# Patient Record
Sex: Female | Born: 1970
Health system: Southern US, Community
[De-identification: ages and names within clinical notes are randomized; demographics above are authoritative.]

## PROBLEM LIST (undated history)

## (undated) DIAGNOSIS — T1490XA Injury, unspecified, initial encounter: Secondary | ICD-10-CM

## (undated) DIAGNOSIS — R112 Nausea with vomiting, unspecified: Secondary | ICD-10-CM

## (undated) DIAGNOSIS — R479 Unspecified speech disturbances: Secondary | ICD-10-CM

## (undated) DIAGNOSIS — S069X9A Unspecified intracranial injury with loss of consciousness of unspecified duration, initial encounter: Secondary | ICD-10-CM

## (undated) DIAGNOSIS — Z9889 Other specified postprocedural states: Secondary | ICD-10-CM

## (undated) DIAGNOSIS — M1990 Unspecified osteoarthritis, unspecified site: Secondary | ICD-10-CM

## (undated) DIAGNOSIS — N950 Postmenopausal bleeding: Secondary | ICD-10-CM

## (undated) DIAGNOSIS — I609 Nontraumatic subarachnoid hemorrhage, unspecified: Secondary | ICD-10-CM

## (undated) DIAGNOSIS — G8190 Hemiplegia, unspecified affecting unspecified side: Secondary | ICD-10-CM

## (undated) HISTORY — PX: JOINT REPLACEMENT: SHX530

## (undated) HISTORY — PX: BREAST REDUCTION SURGERY: SHX8

---
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
## 1999-04-25 ENCOUNTER — Encounter: Payer: Self-pay | Admitting: Emergency Medicine

## 1999-04-25 ENCOUNTER — Emergency Department (HOSPITAL_COMMUNITY): Admission: EM | Admit: 1999-04-25 | Discharge: 1999-04-25 | Payer: Self-pay | Admitting: Emergency Medicine

## 1999-11-23 ENCOUNTER — Emergency Department (HOSPITAL_COMMUNITY): Admission: EM | Admit: 1999-11-23 | Discharge: 1999-11-23 | Payer: Self-pay | Admitting: Emergency Medicine

## 2002-06-09 ENCOUNTER — Other Ambulatory Visit: Admission: RE | Admit: 2002-06-09 | Discharge: 2002-06-09 | Payer: Self-pay | Admitting: Obstetrics and Gynecology

## 2003-10-04 ENCOUNTER — Other Ambulatory Visit: Admission: RE | Admit: 2003-10-04 | Discharge: 2003-10-04 | Payer: Self-pay | Admitting: *Deleted

## 2006-02-13 ENCOUNTER — Encounter: Admission: RE | Admit: 2006-02-13 | Discharge: 2006-02-13 | Payer: Self-pay | Admitting: Internal Medicine

## 2006-05-31 ENCOUNTER — Emergency Department (HOSPITAL_COMMUNITY): Admission: EM | Admit: 2006-05-31 | Discharge: 2006-05-31 | Payer: Self-pay | Admitting: Emergency Medicine

## 2006-10-28 ENCOUNTER — Other Ambulatory Visit: Admission: RE | Admit: 2006-10-28 | Discharge: 2006-10-28 | Payer: Self-pay | Admitting: *Deleted

## 2007-10-03 ENCOUNTER — Emergency Department (HOSPITAL_COMMUNITY): Admission: EM | Admit: 2007-10-03 | Discharge: 2007-10-03 | Payer: Self-pay | Admitting: Emergency Medicine

## 2007-10-08 ENCOUNTER — Ambulatory Visit (HOSPITAL_COMMUNITY): Admission: RE | Admit: 2007-10-08 | Discharge: 2007-10-09 | Payer: Self-pay | Admitting: Orthopedic Surgery

## 2009-09-12 ENCOUNTER — Encounter: Admission: RE | Admit: 2009-09-12 | Discharge: 2009-09-12 | Payer: Self-pay | Admitting: Gynecology

## 2010-06-20 ENCOUNTER — Emergency Department (HOSPITAL_COMMUNITY): Admission: EM | Admit: 2010-06-20 | Discharge: 2010-06-20 | Payer: Self-pay | Admitting: Emergency Medicine

## 2010-06-29 ENCOUNTER — Emergency Department (HOSPITAL_COMMUNITY)
Admission: EM | Admit: 2010-06-29 | Discharge: 2010-06-29 | Payer: Self-pay | Source: Home / Self Care | Admitting: Emergency Medicine

## 2010-07-05 ENCOUNTER — Encounter
Admission: RE | Admit: 2010-07-05 | Discharge: 2010-07-05 | Payer: Self-pay | Source: Home / Self Care | Attending: Gynecology | Admitting: Gynecology

## 2010-07-20 ENCOUNTER — Emergency Department (HOSPITAL_COMMUNITY)
Admission: EM | Admit: 2010-07-20 | Discharge: 2010-07-21 | Payer: Self-pay | Source: Home / Self Care | Admitting: Emergency Medicine

## 2010-07-27 ENCOUNTER — Emergency Department (HOSPITAL_COMMUNITY)
Admission: EM | Admit: 2010-07-27 | Discharge: 2010-07-27 | Payer: Self-pay | Source: Home / Self Care | Admitting: Emergency Medicine

## 2010-08-27 ENCOUNTER — Emergency Department (HOSPITAL_COMMUNITY)
Admission: EM | Admit: 2010-08-27 | Discharge: 2010-08-27 | Payer: Self-pay | Source: Home / Self Care | Admitting: Emergency Medicine

## 2010-10-08 LAB — URINALYSIS, ROUTINE W REFLEX MICROSCOPIC
Bilirubin Urine: NEGATIVE
Hgb urine dipstick: NEGATIVE
Nitrite: NEGATIVE
Specific Gravity, Urine: 1.019 (ref 1.005–1.030)

## 2010-10-08 LAB — POCT I-STAT, CHEM 8
BUN: 15 mg/dL (ref 6–23)
Creatinine, Ser: 1.1 mg/dL (ref 0.4–1.2)
Potassium: 4.1 mEq/L (ref 3.5–5.1)
TCO2: 27 mmol/L (ref 0–100)

## 2010-10-08 LAB — URINE CULTURE

## 2010-10-27 ENCOUNTER — Emergency Department (HOSPITAL_COMMUNITY)
Admission: EM | Admit: 2010-10-27 | Discharge: 2010-10-28 | Disposition: A | Discharge status: Home / Self Care | Payer: Medicare Other | Attending: Emergency Medicine | Admitting: Emergency Medicine

## 2010-10-27 DIAGNOSIS — F411 Generalized anxiety disorder: Secondary | ICD-10-CM | POA: Insufficient documentation

## 2010-10-27 DIAGNOSIS — IMO0002 Reserved for concepts with insufficient information to code with codable children: Secondary | ICD-10-CM | POA: Insufficient documentation

## 2010-10-27 DIAGNOSIS — Z87820 Personal history of traumatic brain injury: Secondary | ICD-10-CM | POA: Insufficient documentation

## 2010-11-28 ENCOUNTER — Emergency Department (HOSPITAL_COMMUNITY)
Admission: EM | Admit: 2010-11-28 | Discharge: 2010-11-28 | Disposition: A | Discharge status: Home / Self Care | Payer: Medicare Other | Attending: Emergency Medicine | Admitting: Emergency Medicine

## 2010-11-28 DIAGNOSIS — Z87820 Personal history of traumatic brain injury: Secondary | ICD-10-CM | POA: Insufficient documentation

## 2010-11-28 DIAGNOSIS — Z046 Encounter for general psychiatric examination, requested by authority: Secondary | ICD-10-CM | POA: Insufficient documentation

## 2010-12-07 ENCOUNTER — Emergency Department (HOSPITAL_COMMUNITY)
Admission: EM | Admit: 2010-12-07 | Discharge: 2010-12-07 | Disposition: A | Discharge status: Home / Self Care | Payer: Medicare Other | Attending: Emergency Medicine | Admitting: Emergency Medicine

## 2010-12-07 DIAGNOSIS — Z87820 Personal history of traumatic brain injury: Secondary | ICD-10-CM | POA: Insufficient documentation

## 2010-12-07 DIAGNOSIS — F911 Conduct disorder, childhood-onset type: Secondary | ICD-10-CM | POA: Insufficient documentation

## 2010-12-11 NOTE — Op Note (Signed)
Candace Bailey, Candace Bailey                 ACCOUNT NO.:  [id number]   MEDICAL RECORD NO.:  [id number]          PATIENT TYPE:  OIB   LOCATION:  5036                         FACILITY:  [facility]   PHYSICIAN:  Nadara Mustard, MD     DATE OF BIRTH:  05-16-1971   DATE OF PROCEDURE:  10/08/2007  DATE OF DISCHARGE:                               OPERATIVE REPORT   PREOPERATIVE DIAGNOSIS:  Intra-articular right elbow medial epicondyle  fracture.   POSTOPERATIVE DIAGNOSIS:  Intra-articular right elbow medial epicondyle  fracture.   PROCEDURE:  Open reduction internal fixation right elbow intra-articular  medial condyle fracture.   SURGEON:  Nadara Mustard, MD.   ANESTHESIA:  General.   ESTIMATED BLOOD LOSS:  Minimal.   ANTIBIOTICS:  Kefzol 1 gram.   DRAINS:  None.   COMPLICATIONS:  None.   TOURNIQUET TIME:  None.   DISPOSITION:  To PACU in stable condition.   INDICATIONS FOR PROCEDURE:  Patient is a 40 year old woman who fell onto  a outstretched right arm sustaining a intra-articular medial epicondyle  fracture.  The patient had instability, displaced fracture and presents  at this time for open reduction internal fixation.  Risks and benefits  were discussed with the patient and her mother including infection,  neurovascular injury, injury to the ulnar nerve, nonhealing of the bone,  nonhealing of the wound, need for additional surgery.  The patient and  mother state they understand and wish to proceed at this time.   DESCRIPTION OF PROCEDURE:  The patient is brought to OR room #15 and  underwent a general anesthetic.  After adequate level of anesthesia  obtained, the patient's right upper extremity was prepped using DuraPrep  and draped into a sterile field.  The medial longitudinal incision was  made centered over the medial epicondyle.  Blunt dissection was carried  down to the ulnar nerve.  The ulnar nerve was released and resected out  to facilitate the reduction of the fracture.   The fracture was reduced  and the precontoured Synthes plate was secured distally with two locking  2.7 screws, one locking 3.5 screw and proximally locked with three 3.5  screws.  The arm was placed through a range of motion.  It was stable.  C-arm fluoroscopy verified the reduction.  There was no impingement with  the elbow after reduction.  The wound was irrigated with normal saline.  The ulnar nerve was again inspected.  There was no contusion bruising to the ulnar nerve.  The subcu was  closed using 2-0 Vicryl.  Skin was closed using approximated staples.  The wound was covered Adaptic orthopedic sponges, sterile Webril and  Coban dressing.  The patient was extubated, plan for a 23-hour  observation.      Nadara Mustard, MD  Electronically Signed     MVD/MEDQ  D:  10/08/2007  T:  10/09/2007  Job:  [id number]

## 2011-01-11 ENCOUNTER — Emergency Department (HOSPITAL_COMMUNITY)
Admission: EM | Admit: 2011-01-11 | Discharge: 2011-01-11 | Disposition: A | Discharge status: Home / Self Care | Payer: Medicare Other | Attending: Emergency Medicine | Admitting: Emergency Medicine

## 2011-01-11 DIAGNOSIS — Z139 Encounter for screening, unspecified: Secondary | ICD-10-CM | POA: Insufficient documentation

## 2011-01-11 DIAGNOSIS — F411 Generalized anxiety disorder: Secondary | ICD-10-CM | POA: Insufficient documentation

## 2011-01-11 DIAGNOSIS — W010XXA Fall on same level from slipping, tripping and stumbling without subsequent striking against object, initial encounter: Secondary | ICD-10-CM | POA: Insufficient documentation

## 2011-01-11 DIAGNOSIS — Y9289 Other specified places as the place of occurrence of the external cause: Secondary | ICD-10-CM | POA: Insufficient documentation

## 2011-01-11 DIAGNOSIS — Z87820 Personal history of traumatic brain injury: Secondary | ICD-10-CM | POA: Insufficient documentation

## 2011-02-02 ENCOUNTER — Emergency Department (HOSPITAL_COMMUNITY)
Admission: EM | Admit: 2011-02-02 | Discharge: 2011-02-02 | Disposition: A | Discharge status: Home / Self Care | Payer: Medicare Other | Attending: Emergency Medicine | Admitting: Emergency Medicine

## 2011-02-02 DIAGNOSIS — X58XXXA Exposure to other specified factors, initial encounter: Secondary | ICD-10-CM | POA: Insufficient documentation

## 2011-02-02 DIAGNOSIS — S7010XA Contusion of unspecified thigh, initial encounter: Secondary | ICD-10-CM | POA: Insufficient documentation

## 2011-02-02 DIAGNOSIS — F411 Generalized anxiety disorder: Secondary | ICD-10-CM | POA: Insufficient documentation

## 2011-02-02 DIAGNOSIS — Z87820 Personal history of traumatic brain injury: Secondary | ICD-10-CM | POA: Insufficient documentation

## 2011-03-14 ENCOUNTER — Emergency Department (HOSPITAL_COMMUNITY): Payer: Medicare Other

## 2011-03-14 ENCOUNTER — Emergency Department (HOSPITAL_COMMUNITY)
Admission: EM | Admit: 2011-03-14 | Discharge: 2011-03-14 | Disposition: A | Discharge status: Home / Self Care | Payer: Medicare Other | Attending: Emergency Medicine | Admitting: Emergency Medicine

## 2011-03-14 DIAGNOSIS — S5000XA Contusion of unspecified elbow, initial encounter: Secondary | ICD-10-CM | POA: Insufficient documentation

## 2011-03-14 DIAGNOSIS — W010XXA Fall on same level from slipping, tripping and stumbling without subsequent striking against object, initial encounter: Secondary | ICD-10-CM | POA: Insufficient documentation

## 2011-03-14 DIAGNOSIS — IMO0002 Reserved for concepts with insufficient information to code with codable children: Secondary | ICD-10-CM | POA: Insufficient documentation

## 2011-03-14 DIAGNOSIS — Y9289 Other specified places as the place of occurrence of the external cause: Secondary | ICD-10-CM | POA: Insufficient documentation

## 2011-04-03 ENCOUNTER — Ambulatory Visit: Payer: Medicare Other | Admitting: Physical Therapy

## 2011-04-17 ENCOUNTER — Emergency Department (HOSPITAL_COMMUNITY)
Admission: EM | Admit: 2011-04-17 | Discharge: 2011-04-17 | Disposition: A | Discharge status: Home / Self Care | Payer: Medicare Other | Attending: Emergency Medicine | Admitting: Emergency Medicine

## 2011-04-17 DIAGNOSIS — R0602 Shortness of breath: Secondary | ICD-10-CM | POA: Insufficient documentation

## 2011-04-17 DIAGNOSIS — F411 Generalized anxiety disorder: Secondary | ICD-10-CM | POA: Insufficient documentation

## 2011-04-17 DIAGNOSIS — Z049 Encounter for examination and observation for unspecified reason: Secondary | ICD-10-CM | POA: Insufficient documentation

## 2011-04-17 DIAGNOSIS — Z87820 Personal history of traumatic brain injury: Secondary | ICD-10-CM | POA: Insufficient documentation

## 2011-04-17 LAB — URINALYSIS, ROUTINE W REFLEX MICROSCOPIC
Glucose, UA: NEGATIVE mg/dL
Hgb urine dipstick: NEGATIVE
Specific Gravity, Urine: 1.022 (ref 1.005–1.030)

## 2011-04-22 LAB — CBC
HCT: 37.3
Hemoglobin: 12.9
MCV: 87.8
RBC: 4.25
RDW: 12.8
WBC: 7.6

## 2011-06-06 ENCOUNTER — Other Ambulatory Visit: Payer: Self-pay | Admitting: Gynecology

## 2011-06-06 DIAGNOSIS — N63 Unspecified lump in unspecified breast: Secondary | ICD-10-CM

## 2011-06-26 ENCOUNTER — Emergency Department (HOSPITAL_COMMUNITY): Payer: Medicare Other

## 2011-06-26 ENCOUNTER — Other Ambulatory Visit: Payer: Self-pay

## 2011-06-26 ENCOUNTER — Emergency Department (HOSPITAL_COMMUNITY)
Admission: EM | Admit: 2011-06-26 | Discharge: 2011-06-26 | Disposition: A | Discharge status: Home / Self Care | Payer: Medicare Other | Attending: Emergency Medicine | Admitting: Emergency Medicine

## 2011-06-26 ENCOUNTER — Encounter: Payer: Self-pay | Admitting: Emergency Medicine

## 2011-06-26 DIAGNOSIS — F419 Anxiety disorder, unspecified: Secondary | ICD-10-CM

## 2011-06-26 DIAGNOSIS — R079 Chest pain, unspecified: Secondary | ICD-10-CM | POA: Insufficient documentation

## 2011-06-26 DIAGNOSIS — R0989 Other specified symptoms and signs involving the circulatory and respiratory systems: Secondary | ICD-10-CM | POA: Insufficient documentation

## 2011-06-26 DIAGNOSIS — R0609 Other forms of dyspnea: Secondary | ICD-10-CM | POA: Insufficient documentation

## 2011-06-26 DIAGNOSIS — R471 Dysarthria and anarthria: Secondary | ICD-10-CM | POA: Insufficient documentation

## 2011-06-26 DIAGNOSIS — R0602 Shortness of breath: Secondary | ICD-10-CM | POA: Insufficient documentation

## 2011-06-26 DIAGNOSIS — R279 Unspecified lack of coordination: Secondary | ICD-10-CM | POA: Insufficient documentation

## 2011-06-26 DIAGNOSIS — F411 Generalized anxiety disorder: Secondary | ICD-10-CM | POA: Insufficient documentation

## 2011-06-26 HISTORY — DX: Injury, unspecified, initial encounter: T14.90XA

## 2011-06-26 LAB — POCT I-STAT TROPONIN I

## 2011-06-26 NOTE — ED Notes (Signed)
Given 324mg ASA by EMS.

## 2011-06-26 NOTE — ED Notes (Signed)
[facility] here to take pt home. 

## 2011-06-26 NOTE — ED Notes (Signed)
Pt states she almost fell with walker tonight and called EMS for [facility].  Per [facility], they said pt c/o chest pain at 2030 that had resolved when they arrived.
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
## 2011-07-02 ENCOUNTER — Emergency Department (HOSPITAL_COMMUNITY)
Admission: EM | Admit: 2011-07-02 | Discharge: 2011-07-02 | Disposition: A | Discharge status: Home / Self Care | Payer: Medicare Other | Attending: Emergency Medicine | Admitting: Emergency Medicine

## 2011-07-02 ENCOUNTER — Encounter (HOSPITAL_COMMUNITY): Payer: Self-pay | Admitting: Family Medicine

## 2011-07-02 DIAGNOSIS — R195 Other fecal abnormalities: Secondary | ICD-10-CM | POA: Insufficient documentation

## 2011-07-02 DIAGNOSIS — K5900 Constipation, unspecified: Secondary | ICD-10-CM | POA: Insufficient documentation

## 2011-07-02 LAB — POCT I-STAT, CHEM 8
BUN: 8 mg/dL (ref 6–23)
Calcium, Ion: 1.14 mmol/L (ref 1.12–1.32)
Chloride: 107 mEq/L (ref 96–112)
Creatinine, Ser: 1 mg/dL (ref 0.50–1.10)
Glucose, Bld: 94 mg/dL (ref 70–99)
HCT: 38 % (ref 36.0–46.0)
Hemoglobin: 12.9 g/dL (ref 12.0–15.0)
Potassium: 3.8 mEq/L (ref 3.5–5.1)
Sodium: 139 mEq/L (ref 135–145)
TCO2: 21 mmol/L (ref 0–100)

## 2011-07-02 LAB — OCCULT BLOOD, POC DEVICE: Fecal Occult Bld: POSITIVE

## 2011-07-02 MED ORDER — DISPOSABLE ENEMA 19-7 GM/118ML RE ENEM: 1.0000 | ENEMA | Freq: Once | RECTAL | Status: AC

## 2011-07-02 NOTE — ED Notes (Signed)
WUJ:[id number]<JY> Expected date:07/02/11<BR> Expected time: 4:39 PM<BR> Means of arrival:Ambulance<BR> Comments:<BR> EMS 12 GC, 40 yof constipation
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
## 2011-07-02 NOTE — ED Notes (Signed)
Pt is A/O x4. Skin warm and dry. Respirations even and unlabored. NAD noted at this time.   

## 2011-07-02 NOTE — ED Notes (Signed)
Phlebotomy at bedside.

## 2011-07-02 NOTE — ED Notes (Signed)
[facility] notified of need for transport. 

## 2011-07-02 NOTE — ED Notes (Signed)
Per EMS: pt from home, lives alone. Reports waking up at 03:00 this morning and trying to use bathroom and unable to have a BM. States she saw blood when wiping. States she has not been able to have a BM in 1 month.

## 2011-07-03 NOTE — ED Provider Notes (Signed)
Medical screening examination/treatment/procedure(s) were performed by non-physician practitioner and as supervising physician I was immediately available for consultation/collaboration.  Jetty Berland, MD 07/03/11 0128 

## 2011-07-06 ENCOUNTER — Emergency Department (HOSPITAL_COMMUNITY)
Admission: EM | Admit: 2011-07-06 | Discharge: 2011-07-07 | Disposition: A | Discharge status: Home / Self Care | Payer: Medicare Other | Attending: Emergency Medicine | Admitting: Emergency Medicine

## 2011-07-06 ENCOUNTER — Encounter (HOSPITAL_COMMUNITY): Payer: Self-pay | Admitting: Emergency Medicine

## 2011-07-06 DIAGNOSIS — K529 Noninfective gastroenteritis and colitis, unspecified: Secondary | ICD-10-CM

## 2011-07-06 DIAGNOSIS — K5900 Constipation, unspecified: Secondary | ICD-10-CM | POA: Insufficient documentation

## 2011-07-06 DIAGNOSIS — R300 Dysuria: Secondary | ICD-10-CM | POA: Insufficient documentation

## 2011-07-06 DIAGNOSIS — K5289 Other specified noninfective gastroenteritis and colitis: Secondary | ICD-10-CM | POA: Insufficient documentation

## 2011-07-06 HISTORY — DX: Unspecified intracranial injury with loss of consciousness of unspecified duration, initial encounter: S06.9X9A

## 2011-07-06 NOTE — ED Notes (Signed)
Pt c/o constipation that is lasting for couple of days now

## 2011-07-06 NOTE — ED Notes (Signed)
[id number]<AV> Expected date:07/06/11<BR> Expected time: 9:46 PM<BR> Means of arrival:Ambulance<BR> Comments:<BR> EMS 22 pTAR constipation

## 2011-07-07 ENCOUNTER — Emergency Department (HOSPITAL_COMMUNITY): Payer: Medicare Other

## 2011-07-07 LAB — DIFFERENTIAL
Basophils Absolute: 0 10*3/uL (ref 0.0–0.1)
Basophils Relative: 0 % (ref 0–1)
Lymphocytes Relative: 11 % — ABNORMAL LOW (ref 12–46)
Neutro Abs: 8.6 10*3/uL — ABNORMAL HIGH (ref 1.7–7.7)
Neutrophils Relative %: 78 % — ABNORMAL HIGH (ref 43–77)

## 2011-07-07 LAB — COMPREHENSIVE METABOLIC PANEL
ALT: 13 U/L (ref 0–35)
AST: 12 U/L (ref 0–37)
Albumin: 3.3 g/dL — ABNORMAL LOW (ref 3.5–5.2)
Alkaline Phosphatase: 75 U/L (ref 39–117)
CO2: 22 mEq/L (ref 19–32)
Chloride: 100 mEq/L (ref 96–112)
Potassium: 3.7 mEq/L (ref 3.5–5.1)
Total Bilirubin: 0.5 mg/dL (ref 0.3–1.2)

## 2011-07-07 LAB — URINALYSIS, ROUTINE W REFLEX MICROSCOPIC
Glucose, UA: NEGATIVE mg/dL
Hgb urine dipstick: NEGATIVE
Ketones, ur: 40 mg/dL — AB
Protein, ur: NEGATIVE mg/dL

## 2011-07-07 LAB — CBC
Hemoglobin: 13.4 g/dL (ref 12.0–15.0)
MCHC: 35.2 g/dL (ref 30.0–36.0)
RDW: 12.4 % (ref 11.5–15.5)
WBC: 11 10*3/uL — ABNORMAL HIGH (ref 4.0–10.5)

## 2011-07-07 MED ORDER — BISACODYL 10 MG RE SUPP
10.0000 mg | Freq: Once | RECTAL | Status: AC
  Administered 2011-07-07: 10 mg via RECTAL
  Filled 2011-07-07: qty 1

## 2011-07-07 MED ORDER — MAGNESIUM CITRATE PO SOLN
1.0000 | Freq: Once | ORAL | Status: AC
  Administered 2011-07-07: 1 via ORAL
  Filled 2011-07-07: qty 296

## 2011-07-07 MED ORDER — SODIUM CHLORIDE 0.9 % IV BOLUS (SEPSIS): 1000.0000 mL | Freq: Once | INTRAVENOUS | Status: DC

## 2011-07-07 MED ORDER — CIPROFLOXACIN HCL 500 MG PO TABS
500.0000 mg | ORAL_TABLET | Freq: Once | ORAL | Status: AC
  Administered 2011-07-07: 500 mg via ORAL
  Filled 2011-07-07: qty 1

## 2011-07-07 MED ORDER — SENNOSIDES-DOCUSATE SODIUM 8.6-50 MG PO TABS: 1.0000 | ORAL_TABLET | Freq: Every day | ORAL | Status: DC

## 2011-07-07 MED ORDER — METRONIDAZOLE 500 MG PO TABS
500.0000 mg | ORAL_TABLET | Freq: Once | ORAL | Status: AC
  Administered 2011-07-07: 500 mg via ORAL
  Filled 2011-07-07: qty 1

## 2011-07-07 MED ORDER — POLYETHYLENE GLYCOL 3350 17 GM/SCOOP PO POWD: 17.0000 g | Freq: Every day | ORAL | Status: AC

## 2011-07-07 MED ORDER — CIPROFLOXACIN HCL 500 MG PO TABS: 500.0000 mg | ORAL_TABLET | Freq: Two times a day (BID) | ORAL | Status: AC

## 2011-07-07 MED ORDER — METRONIDAZOLE 500 MG PO TABS: 500.0000 mg | ORAL_TABLET | Freq: Two times a day (BID) | ORAL | Status: AC

## 2011-07-07 NOTE — ED Notes (Signed)
[facility] contacted, truck is dispatched, pt informed of delay
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
## 2011-07-07 NOTE — ED Notes (Signed)
In and out cath done by ert, reddish rashes noted in perineal area (+) clear vaginal discharge, urine is tea colored

## 2011-07-29 ENCOUNTER — Ambulatory Visit (INDEPENDENT_AMBULATORY_CARE_PROVIDER_SITE_OTHER): Payer: Medicare Other

## 2011-07-29 DIAGNOSIS — R1084 Generalized abdominal pain: Secondary | ICD-10-CM

## 2011-07-29 DIAGNOSIS — R197 Diarrhea, unspecified: Secondary | ICD-10-CM

## 2011-07-29 DIAGNOSIS — R300 Dysuria: Secondary | ICD-10-CM

## 2011-07-31 ENCOUNTER — Emergency Department (HOSPITAL_COMMUNITY): Payer: Medicare Other

## 2011-07-31 ENCOUNTER — Encounter (HOSPITAL_COMMUNITY): Payer: Self-pay

## 2011-07-31 ENCOUNTER — Inpatient Hospital Stay (HOSPITAL_COMMUNITY)
Admission: EM | Admit: 2011-07-31 | Discharge: 2011-08-07 | DRG: 373 | Disposition: A | Discharge status: Home Health | Payer: Medicare Other | Attending: Internal Medicine | Admitting: Internal Medicine

## 2011-07-31 DIAGNOSIS — K529 Noninfective gastroenteritis and colitis, unspecified: Secondary | ICD-10-CM

## 2011-07-31 DIAGNOSIS — R7989 Other specified abnormal findings of blood chemistry: Secondary | ICD-10-CM | POA: Diagnosis present

## 2011-07-31 DIAGNOSIS — S069XAA Unspecified intracranial injury with loss of consciousness status unknown, initial encounter: Secondary | ICD-10-CM

## 2011-07-31 DIAGNOSIS — E349 Endocrine disorder, unspecified: Secondary | ICD-10-CM | POA: Diagnosis present

## 2011-07-31 DIAGNOSIS — R269 Unspecified abnormalities of gait and mobility: Secondary | ICD-10-CM | POA: Diagnosis present

## 2011-07-31 DIAGNOSIS — E860 Dehydration: Secondary | ICD-10-CM | POA: Diagnosis present

## 2011-07-31 DIAGNOSIS — S069X9A Unspecified intracranial injury with loss of consciousness of unspecified duration, initial encounter: Secondary | ICD-10-CM

## 2011-07-31 DIAGNOSIS — A0472 Enterocolitis due to Clostridium difficile, not specified as recurrent: Principal | ICD-10-CM

## 2011-07-31 DIAGNOSIS — Z87820 Personal history of traumatic brain injury: Secondary | ICD-10-CM

## 2011-07-31 DIAGNOSIS — R197 Diarrhea, unspecified: Secondary | ICD-10-CM

## 2011-07-31 HISTORY — DX: Other specified postprocedural states: Z98.890

## 2011-07-31 HISTORY — DX: Nausea with vomiting, unspecified: R11.2

## 2011-07-31 LAB — COMPREHENSIVE METABOLIC PANEL
AST: 15 U/L (ref 0–37)
CO2: 23 mEq/L (ref 19–32)
Calcium: 8.8 mg/dL (ref 8.4–10.5)
Creatinine, Ser: 0.72 mg/dL (ref 0.50–1.10)
GFR calc Af Amer: >90 mL/min (ref >90)
GFR calc non Af Amer: >90 mL/min (ref >90)
Glucose, Bld: 73 mg/dL (ref 70–99)

## 2011-07-31 LAB — CBC
HCT: 38.4 % (ref 36.0–46.0)
MCH: 29.3 pg (ref 26.0–34.0)
MCHC: 34.6 g/dL (ref 30.0–36.0)
MCV: 84.6 fL (ref 78.0–100.0)
RDW: 12.9 % (ref 11.5–15.5)
WBC: 10.7 10*3/uL — ABNORMAL HIGH (ref 4.0–10.5)

## 2011-07-31 LAB — URINALYSIS, ROUTINE W REFLEX MICROSCOPIC
Leukocytes, UA: NEGATIVE
Nitrite: NEGATIVE
Specific Gravity, Urine: 1.031 — ABNORMAL HIGH (ref 1.005–1.030)
Urobilinogen, UA: 0.2 mg/dL (ref 0.0–1.0)

## 2011-07-31 LAB — DIFFERENTIAL
Basophils Absolute: 0 10*3/uL (ref 0.0–0.1)
Eosinophils Relative: 2 % (ref 0–5)
Lymphocytes Relative: 12 % (ref 12–46)
Monocytes Absolute: 0.7 10*3/uL (ref 0.1–1.0)

## 2011-07-31 LAB — URINE MICROSCOPIC-ADD ON

## 2011-07-31 MED ORDER — SODIUM CHLORIDE 0.9 % IV SOLN: INTRAVENOUS | Status: DC

## 2011-07-31 MED ORDER — ONDANSETRON HCL 4 MG/2ML IJ SOLN
4.0000 mg | Freq: Once | INTRAMUSCULAR | Status: AC
  Administered 2011-07-31: 4 mg via INTRAVENOUS
  Filled 2011-07-31: qty 2

## 2011-07-31 MED ORDER — SODIUM CHLORIDE 0.9 % IV BOLUS (SEPSIS)
500.0000 mL | Freq: Once | INTRAVENOUS | Status: AC
  Administered 2011-07-31: 1000 mL via INTRAVENOUS

## 2011-07-31 MED ORDER — IOHEXOL 300 MG/ML  SOLN
100.0000 mL | Freq: Once | INTRAMUSCULAR | Status: AC | PRN
  Administered 2011-07-31: 100 mL via INTRAVENOUS

## 2011-07-31 NOTE — ED Notes (Signed)
Unable to obtain PIV notified IV Team

## 2011-07-31 NOTE — ED Notes (Signed)
Per EMS pt c/o diarrhea x5wks, seen by PCP with no dx; pt denies n/v, no other complaints
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
## 2011-07-31 NOTE — ED Notes (Signed)
IV team nurse at bedside to attempt IV access.

## 2011-07-31 NOTE — ED Notes (Signed)
Pt states she was at [employer] today and had an episode of diarrhea.
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
## 2011-08-01 ENCOUNTER — Encounter (HOSPITAL_COMMUNITY): Payer: Self-pay

## 2011-08-01 DIAGNOSIS — A0472 Enterocolitis due to Clostridium difficile, not specified as recurrent: Secondary | ICD-10-CM | POA: Diagnosis present

## 2011-08-01 DIAGNOSIS — E860 Dehydration: Secondary | ICD-10-CM | POA: Diagnosis present

## 2011-08-01 MED ORDER — ENOXAPARIN SODIUM 40 MG/0.4ML ~~LOC~~ SOLN
40.0000 mg | SUBCUTANEOUS | Status: DC
  Administered 2011-08-02 – 2011-08-06 (×5): 40 mg via SUBCUTANEOUS
  Filled 2011-08-01 (×6): qty 0.4

## 2011-08-01 MED ORDER — ENOXAPARIN SODIUM 40 MG/0.4ML ~~LOC~~ SOLN
40.0000 mg | SUBCUTANEOUS | Status: DC
  Administered 2011-08-01: 40 mg via SUBCUTANEOUS
  Filled 2011-08-01: qty 0.4

## 2011-08-01 MED ORDER — ACETAMINOPHEN 325 MG PO TABS
650.0000 mg | ORAL_TABLET | Freq: Four times a day (QID) | ORAL | Status: DC | PRN
  Administered 2011-08-01: 650 mg via ORAL
  Filled 2011-08-01: qty 2

## 2011-08-01 MED ORDER — METRONIDAZOLE 500 MG PO TABS
500.0000 mg | ORAL_TABLET | Freq: Three times a day (TID) | ORAL | Status: DC
  Administered 2011-08-01 – 2011-08-02 (×3): 500 mg via ORAL
  Filled 2011-08-01 (×6): qty 1

## 2011-08-01 MED ORDER — HYDROCODONE-ACETAMINOPHEN 5-325 MG PO TABS: 1.0000 | ORAL_TABLET | ORAL | Status: DC | PRN

## 2011-08-01 MED ORDER — ACETAMINOPHEN 650 MG RE SUPP: 650.0000 mg | Freq: Four times a day (QID) | RECTAL | Status: DC | PRN

## 2011-08-01 MED ORDER — POTASSIUM CHLORIDE IN NACL 20-0.9 MEQ/L-% IV SOLN
INTRAVENOUS | Status: DC
  Administered 2011-08-01 – 2011-08-04 (×5): via INTRAVENOUS
  Filled 2011-08-01 (×14): qty 1000

## 2011-08-01 MED ORDER — SACCHAROMYCES BOULARDII 250 MG PO CAPS
250.0000 mg | ORAL_CAPSULE | Freq: Two times a day (BID) | ORAL | Status: DC
Start: 2011-08-01 — End: 2011-08-07
  Administered 2011-08-01 – 2011-08-07 (×13): 250 mg via ORAL
  Filled 2011-08-01 (×14): qty 1

## 2011-08-01 NOTE — ED Notes (Signed)
Bladder scan utilized to scan patient's bladder; bladder scan reveals 406 ml.

## 2011-08-01 NOTE — ED Notes (Signed)
Patient states she feels like she needs to use the bathroom. Patient has foley catheter in place. No urine noted in drainage bag or in foley tubing.

## 2011-08-01 NOTE — ED Notes (Signed)
Patient has had 4 bowel movements since 2230. Bowel movements are light brown, gelatinous in consistency.

## 2011-08-01 NOTE — Progress Notes (Signed)
  PCP: Pearla Dubonnet, MD, MD  Consultants: Dr. Dulce Sellar with [facility]  Subjective: Patient denies any complaints. Difficult to communicate with due to history of TBI.  Objective: Vital signs in last 24 hours: Temp:  [97.4 F (36.3 C)-101.1 F (38.4 C)] 97.4 F (36.3 C) (01/03 1151) Pulse Rate:  [86-104] 98  (01/03 1530) Resp:  [16-18] 16  (01/03 0840) BP: (102-123)/(58-72) 113/63 mmHg (01/03 1530) SpO2:  [94 %-100 %] 99 % (01/03 1530) Weight change:     Intake/Output from previous day:   Intake/Output this shift:    General appearance: alert, cooperative, appears older than stated age and no distress Head: Normocephalic, without obvious abnormality, atraumatic Eyes: negative Neck: no adenopathy, no carotid bruit, no JVD, supple, symmetrical, trachea midline and thyroid not enlarged, symmetric, no tenderness/mass/nodules Resp: clear to auscultation bilaterally Cardio: regular rate and rhythm, S1, S2 normal, no murmur, click, rub or gallop GI: normal findings: bowel sounds normal, no bruits heard, no masses palpable and no organomegaly and abnormal findings:  moderate tenderness in the LLQ Extremities: edema 1+ Pulses: 2+ and symmetric Skin: Skin color, texture, turgor normal. No rashes or lesions  Lab Results:  [facility] 07/31/11 1951  WBC 10.7*  HGB 13.3  HCT 38.4  PLT 362   BMET  Basename 07/31/11 1951  NA 139  K 3.6  CL 103  CO2 23  GLUCOSE 73  BUN 8  CREATININE 0.72  CALCIUM 8.8    Studies/Results: Ct Abdomen Pelvis W Contrast  07/31/2011  *RADIOLOGY REPORT*  Clinical Data: Abdominal pain.  Diarrhea.  Colitis.  CT ABDOMEN AND PELVIS WITH CONTRAST  Technique:  Multidetector CT imaging of the abdomen and pelvis was performed following the standard protocol during bolus administration of intravenous contrast.  Contrast: OMNIPAQUE IOHEXOL 300 MG/ML IV SOLN  Comparison: 02/13/2006  Findings: Lung bases are clear.  No pleural or pericardial fluid. The  liver has a normal appearance without focal lesions or biliary ductal dilatation.  The spleen is normal.  The pancreas is normal. The adrenal glands are normal.  The kidneys are normal.  The aorta and IVC are normal.  No retroperitoneal mass or adenopathy.  There is a pattern of diffuse colitis with wall thickening and irregularity from the cecum through the rectum.  Pericolic fat shows mild edematous change but there is no evidence of perforation, fluid collection or abscess.  No primary small bowel pathology is seen.  In the pelvis, there are some small uterine leiomyomas.  No significant bony finding.  IMPRESSION: Pan colitis.  This could be due to inflammatory bowel disease or infectious colitis.  Diffuse wall thickening and irregularity and mild stranding of the pericolic fat.  No sign of perforation or abscess.  Original Report Authenticated By: Thomasenia Sales, M.D.    Medications: I have reviewed the patient's current medications.  Assessment/Plan:  Principal Problem:  *C. difficile colitis Active Problems:  TBI (traumatic brain injury)  Dehydration  1. C-diff Colitis: Started Po flagyl. GI following. Appears to be first episode which has been ongoing for a month.  2. Dehydration: IVF. Monitor closely.  3. History of Traumatic Brain Injury: Stable  4. DVT Prophylaxis: Enoxaparin  Full Code  Dr. Candyce Churn will assume care on 08/02/11.   LOS: 1 day   [facility] Pager [phone] 08/01/2011, 5:35 PM

## 2011-08-01 NOTE — ED Notes (Signed)
Indwelling foley catheter noted to be in vagina.  Catheter removed after balloon deflated.  New foley cath inserted on 2nd attempt; patient tolerated well. Immediate return of amber urine.

## 2011-08-01 NOTE — ED Notes (Signed)
Lab called to inform RN of positive C diff.

## 2011-08-01 NOTE — ED Notes (Signed)
Patient had large, brown, gelatinous BM with flecks of bright red blood. Okay to insert Flexi-Seal per Dr. Joneen Roach. Pericare given.
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
## 2011-08-01 NOTE — ED Notes (Signed)
Rectal Flexi-Seal tube inserted without difficulty; patient tolerated well.

## 2011-08-02 MED ORDER — ZINC OXIDE 11.3 % EX CREA
TOPICAL_CREAM | Freq: Two times a day (BID) | CUTANEOUS | Status: DC
  Administered 2011-08-02 – 2011-08-07 (×10): via TOPICAL
  Filled 2011-08-02: qty 56

## 2011-08-02 MED ORDER — VANCOMYCIN 50 MG/ML ORAL SOLUTION
125.0000 mg | Freq: Four times a day (QID) | ORAL | Status: DC
  Administered 2011-08-02 – 2011-08-07 (×18): 125 mg via ORAL
  Filled 2011-08-02 (×25): qty 2.5

## 2011-08-02 NOTE — Progress Notes (Signed)
Subjective: Diarrhea persists (is incontinent, and has Flexiseal in place).  Objective: Vital signs in last 24 hours: Temp:  [97.4 F (36.3 C)-100.1 F (37.8 C)] 98.5 F (36.9 C) (01/04 0602) Pulse Rate:  [86-105] 94  (01/04 0602) Resp:  [18] 18  (01/04 0602) BP: (102-115)/(62-70) 107/69 mmHg (01/04 0602) SpO2:  [95 %-100 %] 95 % (01/04 0602) Weight:  [67.7 kg (149 lb 4 oz)] 149 lb 4 oz (67.7 kg) (01/03 1815) Weight change:  Last BM Date: 08/01/11  PE: GEN:  Expressive slowing (chronic, history TBI), otherwise NAD ABD:  Soft, non-distended  Lab Results: C diff PCR positive  Studies/Results: Ct Abdomen Pelvis W Contrast  07/31/2011  *RADIOLOGY REPORT*  Clinical Data: Abdominal pain.  Diarrhea.  Colitis.  CT ABDOMEN AND PELVIS WITH CONTRAST  Technique:  Multidetector CT imaging of the abdomen and pelvis was performed following the standard protocol during bolus administration of intravenous contrast.  Contrast: OMNIPAQUE IOHEXOL 300 MG/ML IV SOLN  Comparison: 02/13/2006  Findings: Lung bases are clear.  No pleural or pericardial fluid. The liver has a normal appearance without focal lesions or biliary ductal dilatation.  The spleen is normal.  The pancreas is normal. The adrenal glands are normal.  The kidneys are normal.  The aorta and IVC are normal.  No retroperitoneal mass or adenopathy.  There is a pattern of diffuse colitis with wall thickening and irregularity from the cecum through the rectum.  Pericolic fat shows mild edematous change but there is no evidence of perforation, fluid collection or abscess.  No primary small bowel pathology is seen.  In the pelvis, there are some small uterine leiomyomas.  No significant bony finding.  IMPRESSION: Pan colitis.  This could be due to inflammatory bowel disease or infectious colitis.  Diffuse wall thickening and irregularity and mild stranding of the pericolic fat.  No sign of perforation or abscess.  Original Report Authenticated By:  Thomasenia Sales, M.D.    Assessment:  1.  Clostridium difficile colitis.  Not improving with metronidazole.  Not toxic appearing.  Plan:  1.  Change metronidazole po-->IV (don't know how much she is actually able to take). 2.  Add vancomycin 125 mg po every six hours. 3.  Continue probiotics. 4.  Will follow. 5.  Thank you for the consultation.   Freddy Jaksch 08/02/2011, 11:17 AM
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
## 2011-08-02 NOTE — Progress Notes (Signed)
Chart reviewed and UR completed. 
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
## 2011-08-03 LAB — BASIC METABOLIC PANEL
BUN: 4 mg/dL — ABNORMAL LOW (ref 6–23)
Chloride: 110 mEq/L (ref 96–112)
GFR calc Af Amer: >90 mL/min (ref >90)
Glucose, Bld: 111 mg/dL — ABNORMAL HIGH (ref 70–99)
Potassium: 3.5 mEq/L (ref 3.5–5.1)

## 2011-08-03 LAB — DIFFERENTIAL
Basophils Relative: 0 % (ref 0–1)
Lymphs Abs: 0.8 10*3/uL (ref 0.7–4.0)
Monocytes Absolute: 0.9 10*3/uL (ref 0.1–1.0)
Monocytes Relative: 7 % (ref 3–12)
Neutro Abs: 11.7 10*3/uL — ABNORMAL HIGH (ref 1.7–7.7)

## 2011-08-03 LAB — CBC
HCT: 34.8 % — ABNORMAL LOW (ref 36.0–46.0)
Hemoglobin: 11.5 g/dL — ABNORMAL LOW (ref 12.0–15.0)
MCH: 28.1 pg (ref 26.0–34.0)
MCHC: 33 g/dL (ref 30.0–36.0)

## 2011-08-03 MED ORDER — METRONIDAZOLE IN NACL 5-0.79 MG/ML-% IV SOLN
500.0000 mg | Freq: Three times a day (TID) | INTRAVENOUS | Status: DC
  Administered 2011-08-03 – 2011-08-05 (×6): 500 mg via INTRAVENOUS
  Filled 2011-08-03 (×9): qty 100

## 2011-08-03 MED ORDER — PROMETHAZINE HCL 25 MG/ML IJ SOLN
6.2500 mg | INTRAMUSCULAR | Status: DC | PRN
  Administered 2011-08-03: 6.25 mg via INTRAVENOUS
  Filled 2011-08-03: qty 1

## 2011-08-03 NOTE — Progress Notes (Signed)
Subjective: Patient pleasant, no apparent distress. However she is still having excessive diarrhea. She's a little upset about the amount of work she needs to have. GI Notes has been reviewed. The input appreciated  Objective: Vital signs in last 24 hours: Temp:  [98.4 F (36.9 C)-99 F (37.2 C)] 98.4 F (36.9 C) (01/05 0539) Pulse Rate:  [88-96] 88  (01/05 0539) Resp:  [16-20] 16  (01/05 0539) BP: (113-123)/(70-78) 115/73 mmHg (01/05 0539) SpO2:  [96 %-98 %] 96 % (01/05 0539) Weight change:  Last BM Date: 08/02/11  Intake/Output from previous day: 01/04 0701 - 01/05 0700 In: 1440 [P.O.:240; I.V.:1200] Out: 825 [Urine:325; Stool:500] Intake/Output this shift:    General appearance: alert GI: soft, non-tender; bowel sounds normal; no masses,  no organomegaly  Lab Results:  Basename 08/03/11 0410 07/31/11 1951  WBC 13.8* 10.7*  HGB 11.5* 13.3  HCT 34.8* 38.4  PLT 343 362   BMET  Basename 08/03/11 0410 07/31/11 1951  NA 135 139  K 3.5 3.6  CL 110 103  CO2 19 23  GLUCOSE 111* 73  BUN 4* 8  CREATININE 0.64 0.72  CALCIUM 8.0* 8.8    Studies/Results: No results found.  Medications:  Prior to Admission:  Prescriptions prior to admission  Medication Sig Dispense Refill  . docusate sodium (COLACE) 100 MG capsule Take 100 mg by mouth 2 (two) times daily.        Marland Kitchen senna-docusate (SENOKOT-S) 8.6-50 MG per tablet Take 1 tablet by mouth daily.  30 tablet  0   Scheduled:   . enoxaparin (LOVENOX) injection  40 mg Subcutaneous Q24H  . metronidazole  500 mg Intravenous Q8H  . saccharomyces boulardii  250 mg Oral BID  . vancomycin  125 mg Oral QID  . zinc oxide   Topical BID   Continuous:   . 0.9 % NaCl with KCl 20 mEq / L 75 mL/hr at 08/03/11 1220    Assessment/Plan: Principal Problem:  *C. difficile colitis Active Problems:  TBI (traumatic brain injury)  Dehydration  At this time will continue current treatment, further recommendations as deemed necessary  LOS: 3 days   Candace Bailey D 08/03/2011, 12:35 PM

## 2011-08-03 NOTE — Progress Notes (Signed)
Patient ID: Candace Bailey, female   DOB: 06/28/1971, 41 y.o.   MRN: [id number] [facility] Progress Note  Kitti Mcclish 41 y.o. [birth year]   Subjective: Diarrhea persisting. No complaints. Wants to go home.3  Objective: Vital signs: Filed Vitals:   08/03/11 0539  BP: 115/73  Pulse: 88  Temp: 98.4 F (36.9 C)  Resp: 16    Intake/Output last 24 hrs:  Intake/Output Summary (Last 24 hours) at 08/03/11 1158 Last data filed at 08/03/11 0600  Gross per 24 hour  Intake   1200 ml  Output    725 ml  Net    475 ml    Physical Exam: Gen: awake, no acute distress  Abd: diffusely tender with guarding, soft, nondistended3  Lab Results:  [facility] 08/03/11 0410 07/31/11 1951  NA 135 139  K 3.5 3.6  CL 110 103  CO2 19 23  GLUCOSE 111* 73  BUN 4* 8  CREATININE 0.64 0.72  CALCIUM 8.0* 8.8  MG -- --  PHOS -- --    Basename 07/31/11 1951  AST 15  ALT 11  ALKPHOS 77  BILITOT 0.3  PROT 6.7  ALBUMIN 3.3*    Basename 08/03/11 0410 07/31/11 1951  WBC 13.8* 10.7*  NEUTROABS 11.7* 8.5*  HGB 11.5* 13.3  HCT 34.8* 38.4  MCV 85.1 84.6  PLT 343 362     Medications: I have reviewed the patient's current medications.  Assessment/Plan: C diff colitis : Restart IV Flagyl; continue PO vanco, probiotic, and supportive care. Clears. Will follow.   Leira Regino C. 08/03/2011, 11:58 AM

## 2011-08-04 NOTE — Progress Notes (Signed)
Subjective: Patient is doing well, she will like more of a regular diet. Her diarrhea appears to be subsiding. TSH has returned elevated, there is no documentation of prior history of hypothyroidism, will check records, repeat TSH, if persistently elevated treatment will be indicated  Objective: Vital signs in last 24 hours: Temp:  [97.2 F (36.2 C)-100.4 F (38 C)] 97.9 F (36.6 C) (01/06 0639) Pulse Rate:  [81-95] 81  (01/06 0639) Resp:  [18] 18  (01/06 0639) BP: (101-110)/(67-73) 110/73 mmHg (01/06 0639) SpO2:  [95 %-97 %] 95 % (01/06 0639) Weight change:  Last BM Date: 08/03/11  Intake/Output from previous day: 01/05 0701 - 01/06 0700 In: 1752.5 [I.V.:1552.5; IV Piggyback:200] Out: 550 [Urine:275; Stool:275] Intake/Output this shift:    General appearance: alert GI: soft, non-tender; bowel sounds normal; no masses,  no organomegaly  Lab Results:  Basename 08/03/11 0410  WBC 13.8*  HGB 11.5*  HCT 34.8*  PLT 343   BMET  Basename 08/03/11 0410  NA 135  K 3.5  CL 110  CO2 19  GLUCOSE 111*  BUN 4*  CREATININE 0.64  CALCIUM 8.0*    Studies/Results: No results found.  Medications:  Prior to Admission:  Prescriptions prior to admission  Medication Sig Dispense Refill  . docusate sodium (COLACE) 100 MG capsule Take 100 mg by mouth 2 (two) times daily.        Marland Kitchen senna-docusate (SENOKOT-S) 8.6-50 MG per tablet Take 1 tablet by mouth daily.  30 tablet  0   Scheduled:   . enoxaparin (LOVENOX) injection  40 mg Subcutaneous Q24H  . metronidazole  500 mg Intravenous Q8H  . saccharomyces boulardii  250 mg Oral BID  . vancomycin  125 mg Oral QID  . zinc oxide   Topical BID   Continuous:   . 0.9 % NaCl with KCl 20 mEq / L 75 mL/hr at 08/04/11 1109    Assessment/Plan: C. difficile colitis, appears to be improving, agree with GI to DC rectal tube. We will advance diet. Hypothyroidism, check followup TSH.  LOS: 4 days   Candace Bailey D 08/04/2011, 11:49 AM

## 2011-08-04 NOTE — Progress Notes (Signed)
Patient ID: Candace Bailey, female   DOB: 1970/12/26, 41 y.o.   MRN: [id number] [facility] Gastroenterology Progress Note  Candace Bailey 41 y.o. Nov 28, 1970   Subjective: Diarrhea continuing. Small amount in rectal tube. Mother at bedside.  Objective: Vital signs: Filed Vitals:   08/04/11 0639  BP: 110/73  Pulse: 81  Temp: 97.9 F (36.6 C)  Resp: 18    Intake/Output last 24 hrs:  Intake/Output Summary (Last 24 hours) at 08/04/11 1120 Last data filed at 08/04/11 0634  Gross per 24 hour  Intake 1752.5 ml  Output    550 ml  Net 1202.5 ml    Physical Exam: Gen: alert, no acute distress  Abd: diffusely tender with minimal guarding, soft, nondistended  Lab Results:  Basename 08/03/11 0410  NA 135  K 3.5  CL 110  CO2 19  GLUCOSE 111*  BUN 4*  CREATININE 0.64  CALCIUM 8.0*  MG --  PHOS --   No results found for this basename: AST:2,ALT:2,ALKPHOS:2,BILITOT:2,PROT:2,ALBUMIN:2 in the last 72 hours  Basename 08/03/11 0410  WBC 13.8*  NEUTROABS 11.7*  HGB 11.5*  HCT 34.8*  MCV 85.1  PLT 343     Medications: I have reviewed the patient's current medications.  Assessment/Plan: 40yo with C. Diff colitis slow to improve. Vomited up one dose of PO Vanco. yesterday.Will continue PO Vanco, IV flagyl, and probiotic. D/C rectal tube. Continue supportive care.    Rosana Farnell C. 08/04/2011, 11:20 AM

## 2011-08-05 DIAGNOSIS — R7989 Other specified abnormal findings of blood chemistry: Secondary | ICD-10-CM | POA: Diagnosis present

## 2011-08-05 LAB — CBC
HCT: 32.1 % — ABNORMAL LOW (ref 36.0–46.0)
Hemoglobin: 11.1 g/dL — ABNORMAL LOW (ref 12.0–15.0)
MCV: 85.4 fL (ref 78.0–100.0)
RBC: 3.76 MIL/uL — ABNORMAL LOW (ref 3.87–5.11)
WBC: 9 10*3/uL (ref 4.0–10.5)

## 2011-08-05 LAB — BASIC METABOLIC PANEL
CO2: 20 mEq/L (ref 19–32)
Calcium: 7.7 mg/dL — ABNORMAL LOW (ref 8.4–10.5)
Chloride: 107 mEq/L (ref 96–112)
Creatinine, Ser: 0.56 mg/dL (ref 0.50–1.10)
Glucose, Bld: 90 mg/dL (ref 70–99)
Sodium: 136 mEq/L (ref 135–145)

## 2011-08-05 LAB — DIFFERENTIAL
Basophils Absolute: 0.1 10*3/uL (ref 0.0–0.1)
Eosinophils Relative: 3 % (ref 0–5)
Lymphocytes Relative: 8 % — ABNORMAL LOW (ref 12–46)
Lymphs Abs: 0.8 10*3/uL (ref 0.7–4.0)
Monocytes Absolute: 0.6 10*3/uL (ref 0.1–1.0)
Neutro Abs: 7.3 10*3/uL (ref 1.7–7.7)

## 2011-08-05 LAB — T4, FREE: Free T4: 1.07 ng/dL (ref 0.80–1.80)

## 2011-08-05 LAB — TSH: TSH: 5.656 u[IU]/mL — ABNORMAL HIGH (ref 0.350–4.500)

## 2011-08-05 MED ORDER — METRONIDAZOLE 500 MG PO TABS
500.0000 mg | ORAL_TABLET | Freq: Three times a day (TID) | ORAL | Status: DC
  Administered 2011-08-05 (×2): 500 mg via ORAL
  Filled 2011-08-05 (×6): qty 1

## 2011-08-05 NOTE — Progress Notes (Signed)
Subjective: Diarrhea improving but still significant. With the patient's disabilities, diarrhea needs to have resolved prior to discharge. She cannot manage to get to the bathroom in time with diarrhea.  Objective: Weight change:   Intake/Output Summary (Last 24 hours) at 08/05/11 0651 Last data filed at 08/05/11 0518  Gross per 24 hour  Intake   4596 ml  Output    400 ml  Net   4196 ml    General Appearance: Alert, cooperative, no distress, appears stated age Head: Normocephalic, without obvious abnormality, atraumatic Neck: Supple, symmetrical Lungs: Clear to auscultation bilaterally, respirations unlabored Heart: Regular rate and rhythm, S1 and S2 normal, no murmur, rub or gallop Abdomen: Soft, non-tender, bowel sounds active all four quadrants, no masses, no organomegaly Extremities: Extremities normal, atraumatic, no cyanosis or edema Pulses: 2+ and symmetric all extremities Skin: Skin color, texture, turgor normal, no rashes or lesions Neuro: CNII-XII intact. Normal strength, sensation and reflexes throughout   Lab Results:  Basename 08/03/11 0410  NA 135  K 3.5  CL 110  CO2 19  GLUCOSE 111*  BUN 4*  CREATININE 0.64  CALCIUM 8.0*  MG --  PHOS --   No results found for this basename: AST:2,ALT:2,ALKPHOS:2,BILITOT:2,PROT:2,ALBUMIN:2 in the last 72 hours No results found for this basename: LIPASE:2,AMYLASE:2 in the last 72 hours  Basename 08/03/11 0410  WBC 13.8*  NEUTROABS 11.7*  HGB 11.5*  HCT 34.8*  MCV 85.1  PLT 343   No results found for this basename: CKTOTAL:3,CKMB:3,CKMBINDEX:3,TROPONINI:3 in the last 72 hours No components found with this basename: POCBNP:3 No results found for this basename: DDIMER:2 in the last 72 hours No results found for this basename: HGBA1C:2 in the last 72 hours No results found for this basename: CHOL:2,HDL:2,LDLCALC:2,TRIG:2,CHOLHDL:2,LDLDIRECT:2 in the last 72 hours  Basename 08/03/11 0410  TSH 6.781*  T4TOTAL --    T3FREE --  THYROIDAB --   No results found for this basename: VITAMINB12:2,FOLATE:2,FERRITIN:2,TIBC:2,IRON:2,RETICCTPCT:2 in the last 72 hours  Studies/Results: No results found. Medications: Scheduled Meds:   . enoxaparin (LOVENOX) injection  40 mg Subcutaneous Q24H  . metronidazole  500 mg Intravenous Q8H  . saccharomyces boulardii  250 mg Oral BID  . vancomycin  125 mg Oral QID  . zinc oxide   Topical BID   Continuous Infusions:   . 0.9 % NaCl with KCl 20 mEq / L 75 mL/hr at 08/04/11 1109   PRN Meds:.acetaminophen, acetaminophen, HYDROcodone-acetaminophen, promethazine  Assessment/Plan: Patient Active Problem List  Diagnoses Date Noted  . C. difficile colitis - diarrhea improving but not resolved. Continue vancomycin and Flagyl  08/01/2011  . Dehydration - improved. Check electrolytes and CBC today  08/01/2011  . TBI (traumatic brain injury) - aware. Continue physical therapy  07/31/2011     LOS: 5 days   Candace Bailey 08/05/2011, 6:51 AM

## 2011-08-05 NOTE — Progress Notes (Signed)
Subjective: Mother and pt with multiple ?s about cdiff, how to take vanco, costs, rehab, and multiple other issues.  Pt apparently can't handle liquid meds. Diarrhea is some better.  Objective: Vital signs in last 24 hours: Temp:  [95.7 F (35.4 C)-98.2 F (36.8 C)] 98.2 F (36.8 C) (01/07 0520) Pulse Rate:  [98-112] 98  (01/07 0520) Resp:  [18-20] 18  (01/07 0520) BP: (115-137)/(72-87) 115/72 mmHg (01/07 0520) SpO2:  [94 %-98 %] 94 % (01/07 0520) Last BM Date: 08/04/11  Intake/Output from previous day: 01/06 0701 - 01/07 0700 In: 4596 [P.O.:1280; I.V.:2916; IV Piggyback:400] Out: 400 [Urine:400] Intake/Output this shift:    PE:Gen-alert and oriented Abd-soft nontender  Lab Results:  Basename 08/05/11 0630 08/03/11 0410  WBC 9.0 13.8*  HGB 11.1* 11.5*  HCT 32.1* 34.8*  PLT 345 343   BMET  Basename 08/05/11 0630 08/03/11 0410  NA 136 135  K 3.4* 3.5  CL 107 110  CO2 20 19  CREATININE 0.56 0.64   LFT No results found for this basename: PROT:3ALBUMIN:3,AST:3,ALT:3,ALKPHOS:3,BILITOT:3,BILIDIR:3,IBILI:3 in the last 72 hours PT/INR No results found for this basename: LABPROT:3,INR:3 in the last 72 hours Hepatitis Panel No results found for this basename: HEPBSAG,HCVAB,HEPAIGM,HEPBIGM in the last 72 hours     Studies/Results: No results found.  Medications: I have reviewed the patient's current medications.  Assessment/Plan: 1. C diff diarrhea.  Appears to be slowly improving.  Long discussion about need for florastor and the possibility of relapse and futher infection with AB usage.  Would recommend 10 day course of vanco 125 QID and would continue florastor with tapering dose for about 2 months.  Please call us for further problems.  Jericca Russett JR,Scout Gumbs L 08/05/2011, 10:20 AM

## 2011-08-05 NOTE — Progress Notes (Signed)
Physical Therapy Treatment Patient Details Name: Candace Bailey MRN: [id number] DOB: 1971/04/29 Today's Date: 08/05/2011 Time: 4098-1191 Charge: Leonia Reeves PT Assessment/Plan  PT - Assessment/Plan Comments on Treatment Session: Therapist assisted pt with donning shoes and L AFO prior to gait.  Pt with excessive jerky movements of R UE upon active engagement of extremity.  Pt able to ambulate in hallway with minA for occasional LOB but limited 2* to fatigue and dizziness. PT Plan: Discharge plan remains appropriate Follow Up Recommendations: Skilled nursing facility;Supervision/Assistance - 24 hour Equipment Recommended: None recommended by PT PT Goals  Acute Rehab PT Goals PT Goal: Supine/Side to Sit - Progress: Progressing toward goal PT Goal: Sit to Stand - Progress: Progressing toward goal PT Goal: Ambulate - Progress: Progressing toward goal  PT Treatment Precautions/Restrictions  Precautions Precautions: Fall Required Braces or Orthoses: Yes Other Brace/Splint: Left AFO Restrictions Weight Bearing Restrictions: No Mobility (including Balance) Bed Mobility Bed Mobility: Yes Supine to Sit: 5: Supervision;HOB elevated (Comment degrees);With rails Supine to Sit Details (indicate cue type and reason): HOB70* Sit to Supine: 5: Supervision;HOB elevated (comment degrees) Sit to Supine - Details (indicate cue type and reason): HOB 70* Transfers Transfers: Yes Sit to Stand: 4: Min assist;With upper extremity assist;From bed Sit to Stand Details (indicate cue type and reason): min/guard for safety Stand to Sit: 4: Min assist;To bed Stand to Sit Details: assist to control descent Ambulation/Gait Ambulation/Gait: Yes Ambulation/Gait Assistance: 4: Min assist Ambulation/Gait Assistance Details (indicate cue type and reason): assist required for LOB x3 otherwise min/guard, pt reported L hand pain from IV site (line already removed), pt able to unlock brakes prior to initiating gait, pt reported  dizziness and weakness with ambulation "I've been in the bed too long" Ambulation Distance (Feet): 80 Feet Assistive device:  (pt's rollator) Gait Pattern: Scissoring;Ataxic;Decreased stride length (narrow BOS)    Exercise    End of Session PT - End of Session Equipment Utilized During Treatment: Gait belt;Left ankle foot orthosis Activity Tolerance: Patient limited by fatigue Patient left: in bed;with call bell in reach General Behavior During Session: [facility] for tasks performed Cognition: [facility] for tasks performed  Alanni Vader,[name] 08/05/2011, 4:06 PM Pager: [phone]

## 2011-08-06 LAB — TSH: TSH: 7.603 u[IU]/mL — ABNORMAL HIGH (ref 0.350–4.500)

## 2011-08-06 NOTE — Progress Notes (Signed)
08/06/11, Kathi Der RNC-MNN, BSN, [phone].  CM received referral.  CM met with pt. and offered choice for [facility] services.  Pt. states that she has used [facility] before and wants to use them again for [facility] services.  Darl Pikes at [facility] contacted for [facility] PT as ordered-confirmation received.  Also discussed medications with pt.  Pt. lives alone, pt's mother will be able to assist as needed upon discharge.

## 2011-08-06 NOTE — Progress Notes (Signed)
Subjective: Slowly improving. Still feels very weak. Wants to see she can ambulate safely 1 more day prior to discharge which I think is quite reasonable. Will involve care management for valuation for home needs. Physical therapy 3 times a week would be reasonable for several weeks  Objective: Weight change:   Intake/Output Summary (Last 24 hours) at 08/06/11 1610 Last data filed at 08/06/11 0500  Gross per 24 hour  Intake    240 ml  Output    800 ml  Net   -560 ml    Filed Vitals:   08/05/11 0520 08/05/11 1310 08/05/11 2055 08/06/11 0541  BP: 115/72 145/83 129/75 106/70  Pulse: 98 103 107 88  Temp: 98.2 F (36.8 C) 97.6 F (36.4 C) 98.9 F (37.2 C) 98.4 F (36.9 C)  TempSrc: Oral Oral Oral Oral  Resp: 18 18 18 16   Height:      Weight:      SpO2: 94% 97% 97% 96%   General Appearance: Alert, cooperative, no distress, appears stated age  Neck: Supple, symmetrical  Lungs: Clear to auscultation bilaterally, respirations unlabored  Heart: Regular rate and rhythm, S1 and S2 normal, no murmur, rub or gallop  Abdomen: Soft, mildly tender with increased bowel sounds, is no longer incontinent of stool Extremities: Spasticity and flexion contractures from prior traumatic brain injury as a small child  Pulses: 2+ and symmetric all extremities  Skin: Skin color, texture, turgor normal, no rashes or lesions  Neuro: CNII-XII intact. Normal strength, sensation and reflexes throughout     Lab Results:  Basename 08/05/11 0630  NA 136  K 3.4*  CL 107  CO2 20  GLUCOSE 90  BUN 5*  CREATININE 0.56  CALCIUM 7.7*  MG --  PHOS --   No results found for this basename: AST:2,ALT:2,ALKPHOS:2,BILITOT:2,PROT:2,ALBUMIN:2 in the last 72 hours No results found for this basename: LIPASE:2,AMYLASE:2 in the last 72 hours  Basename 08/05/11 0630  WBC 9.0  NEUTROABS 7.3  HGB 11.1*  HCT 32.1*  MCV 85.4  PLT 345   No results found for this basename:  CKTOTAL:3,CKMB:3,CKMBINDEX:3,TROPONINI:3 in the last 72 hours No components found with this basename: POCBNP:3 No results found for this basename: DDIMER:2 in the last 72 hours No results found for this basename: HGBA1C:2 in the last 72 hours No results found for this basename: CHOL:2,HDL:2,LDLCALC:2,TRIG:2,CHOLHDL:2,LDLDIRECT:2 in the last 72 hours  Basename 08/05/11 0630  TSH 5.656*  T4TOTAL --  T3FREE --  THYROIDAB --   No results found for this basename: VITAMINB12:2,FOLATE:2,FERRITIN:2,TIBC:2,IRON:2,RETICCTPCT:2 in the last 72 hours  Studies/Results: No results found. Medications: Scheduled Meds:   . enoxaparin (LOVENOX) injection  40 mg Subcutaneous Q24H  . metroNIDAZOLE  500 mg Oral TID  . saccharomyces boulardii  250 mg Oral BID  . vancomycin  125 mg Oral QID  . zinc oxide   Topical BID  . DISCONTD: metronidazole  500 mg Intravenous Q8H   Continuous Infusions:   . 0.9 % NaCl with KCl 20 mEq / L 75 mL/hr at 08/04/11 1109   PRN Meds:.acetaminophen, acetaminophen, HYDROcodone-acetaminophen, promethazine  Assessment/Plan: Patient Active Problem List  Diagnoses Date Noted  . Abnormal TSH - followup as outpatient  08/05/2011  . C. difficile colitis - discontinue Flagyl and make sure she is tolerating vancomycin well 4 times a day prior to discharge and that she can get her vancomycin medication  08/01/2011  . Dehydration - resolved  08/01/2011  . TBI (traumatic brain injury) - chronic with gait disorder, continue ambulation  with plans for discharge home with home physical therapy on 08/07/2011  07/31/2011     LOS: 6 days   Candace Bailey 08/06/2011, 6:32 AM

## 2011-08-07 MED ORDER — VANCOMYCIN 50 MG/ML ORAL SOLUTION: 125.0000 mg | Freq: Four times a day (QID) | ORAL | Status: DC

## 2011-08-07 MED ORDER — SACCHAROMYCES BOULARDII 250 MG PO CAPS: 250.0000 mg | ORAL_CAPSULE | Freq: Two times a day (BID) | ORAL | Status: AC

## 2011-08-07 MED ORDER — ZINC OXIDE 11.3 % EX CREA: 1.0000 "application " | TOPICAL_CREAM | Freq: Two times a day (BID) | CUTANEOUS | Status: DC

## 2011-08-07 NOTE — Progress Notes (Signed)
Patient discharged home, all discharge medications and instructions reviewed and questions answered. Pt to be assisted to vehicle by wheelchair.  

## 2011-08-07 NOTE — Progress Notes (Signed)
Physical Therapy Treatment Patient Details Name: Candace Bailey MRN: [id number] DOB: 1971/05/01 Today's Date: 08/07/2011 Time: 2440-1027 Charge: Candace Bailey PT Assessment/Plan  PT - Assessment/Plan Comments on Treatment Session: Pt reports she's ready for d/c.  Pt did well with ambulation and requested a few times for PT to "let go" as she is used to being independent.  Assisted pt with donning shoes and AFO although she reports she does this independently at home. PT Plan: Discharge plan remains appropriate;Frequency remains appropriate Follow Up Recommendations: Supervision/Assistance - 24 hour;Skilled nursing facility Equipment Recommended: None recommended by PT PT Goals  Acute Rehab PT Goals PT Goal: Supine/Side to Sit - Progress: Progressing toward goal PT Goal: Sit to Stand - Progress: Progressing toward goal PT Goal: Ambulate - Progress: Progressing toward goal  PT Treatment Precautions/Restrictions  Precautions Precautions: Fall Required Braces or Orthoses: Yes Other Brace/Splint: Left AFO Restrictions Weight Bearing Restrictions: No Mobility (including Balance) Bed Mobility Bed Mobility: Yes Supine to Sit: 5: Supervision;With rails Supine to Sit Details (indicate cue type and reason): HOB 65* Transfers Transfers: Yes Sit to Stand: 4: Min assist;From elevated surface;From bed Sit to Stand Details (indicate cue type and reason): min/guard, keeps hands on rollator Stand to Sit: To chair/3-in-1 Stand to Sit Details: min/guard, demonstrates decreased controlled descent Ambulation/Gait Ambulation/Gait: Yes Ambulation/Gait Assistance: 4: Min assist Ambulation/Gait Assistance Details (indicate cue type and reason): minA for LOB x1 2* pt trying to step over catheter line because she doesn't like to see catheter or bag Ambulation Distance (Feet): 400 Feet Assistive device:  (pt's rollator) Gait Pattern: Scissoring;Ataxic;Decreased stride length (narrow BOS)    Exercise    End of  Session PT - End of Session Equipment Utilized During Treatment: Left ankle foot orthosis Activity Tolerance: Patient tolerated treatment well Patient left: in chair (with RN administering meds) General Behavior During Session: [facility] for tasks performed Cognition: [facility] for tasks performed (hx TBI)  Candace Bailey,Candace Bailey 08/07/2011, 3:03 PM Pager: [phone]

## 2011-08-07 NOTE — Progress Notes (Signed)
08/07/11,  Kathi Der RNC-MNN, BSN, CM received additional referral for the addition of OT, RN, and aide.  PT already ordered for pt.  Darl Pikes at [facility] contacted with additional orders and confirmed.

## 2011-08-07 NOTE — Discharge Summary (Addendum)
Physician Discharge Summary  NAME:Candace Bailey  ZOX:[id number]  DOB: Mar 21, 1971   Admit date: 07/31/2011 Discharge date: 08/07/2011  Discharge Diagnoses:  Principal Problem: C. difficile colitis  -  symptomatically improved Active Problems:  TBI (traumatic brain injury) - chronic with gait disorder  Dehydration - resolved  Abnormal TSH - followup as outpatient   Discharge Condition: Much improved  Hospital Course: Ms. Candace Bailey is a very pleasant 41 year old female who has had diarrhea for a month. She was treated several weeks ago with Cipro and Flagyl and diarrhea resolved for a short period of time, probably about 4-5 days. However her diarrhea resumed and has been severe. She presented to the [facility] emergency room on 07/31/2011 and was found to have a positive C. difficile titer on stool testing and CT scan of the abdomen revealed diffuse colitis. She was admitted and started on Flagyl and vancomycin was added secondary to persistent diarrhea and she is doing very well now without diarrhea for approximately 48 hours. She was markedly weakened by her illness and has required physical therapy and now is ambulatory with her walker and able to be discharged. She'll be discharged home on vancomycin 4 times a day for 10 more days and I will follow her back up in the office in one week. While hospitalized she was noted to have a slightly high TSH we will follow up that lab abnormality as an outpatient. Candace Bailey will receive services from home health. She lives independently, quite remarkably and overall does quite well. She has severe spasticity in her extremities secondary to her traumatic brain injury as a child  Consults: Treatment Team:  Vertell Novak., MD  Disposition: Home or Self Care  Filed Vitals:   08/06/11 0541 08/06/11 1300 08/06/11 2045 08/07/11 0617  BP: 106/70 141/82 130/82 111/75  Pulse: 88 96 111 78  Temp: 98.4 F (36.9 C) 97 F (36.1 C) 98.9 F (37.2 C) 98.8 F  (37.1 C)  TempSrc: Oral Oral Oral Oral  Resp: 16 18 16 16   Height:      Weight:      SpO2: 96% 97% 98% 97%   General Appearance: Alert, cooperative, no distress, appears stated age  Neck: Supple, symmetrical  Lungs: Clear to auscultation bilaterally, respirations unlabored  Heart: Regular rate and rhythm, S1 and S2 normal, no murmur, rub or gallop  Abdomen: Soft, mildly tender with increased bowel sounds, is no longer incontinent of stool  Extremities: Spasticity and flexion contractures from prior traumatic brain injury as a small child  Pulses: 2+ and symmetric all extremities  Skin: Skin color, texture, turgor normal, no rashes or lesions  Neuro: CNII-XII intact. Normal strength, sensation and reflexes throughout  Discharge Orders    Future Orders Please Complete By Expires   Diet - low sodium heart healthy      Increase activity slowly      Discharge instructions      Comments:   Take vancomycin 4 times a day at discharge for 10 days. Schedule appointment in one week to see Dr. Kevan Ny. Call [phone] or [phone] to make an appointment in one week. Call Dr. Kevan Ny if diarrhea recurs     Current Discharge Medication List    START taking these medications   Details  saccharomyces boulardii (FLORASTOR) 250 MG capsule Take 1 capsule (250 mg total) by mouth 2 (two) times daily. Qty: 60 capsule, Refills: 0    vancomycin (VANCOCIN) 50 mg/mL oral solution Take 2.5 mLs (125 mg total)  by mouth 4 (four) times daily. Qty: 100 mL, Refills: 0    zinc oxide (BALMEX) 11.3 % CREA cream Apply 1 application topically 2 (two) times daily. Qty: 50 g, Refills: 1      STOP taking these medications     docusate sodium (COLACE) 100 MG capsule      senna-docusate (SENOKOT-S) 8.6-50 MG per tablet        Follow-up Information    Follow up with Candace Vandall NEVILL, MD .         Things to follow up in the outpatient setting:  Elevated TSH  Time coordinating discharge: 20 minutes  The  results of significant diagnostics from this hospitalization (including imaging, microbiology, ancillary and laboratory) are listed below for reference.    Significant Diagnostic Studies: Ct Abdomen Pelvis W Contrast  07/31/2011  *RADIOLOGY REPORT*  Clinical Data: Abdominal pain.  Diarrhea.  Colitis.  CT ABDOMEN AND PELVIS WITH CONTRAST  Technique:  Multidetector CT imaging of the abdomen and pelvis was performed following the standard protocol during bolus administration of intravenous contrast.  Contrast: OMNIPAQUE IOHEXOL 300 MG/ML IV SOLN  Comparison: 02/13/2006  Findings: Lung bases are clear.  No pleural or pericardial fluid. The liver has a normal appearance without focal lesions or biliary ductal dilatation.  The spleen is normal.  The pancreas is normal. The adrenal glands are normal.  The kidneys are normal.  The aorta and IVC are normal.  No retroperitoneal mass or adenopathy.  There is a pattern of diffuse colitis with wall thickening and irregularity from the cecum through the rectum.  Pericolic fat shows mild edematous change but there is no evidence of perforation, fluid collection or abscess.  No primary small bowel pathology is seen.  In the pelvis, there are some small uterine leiomyomas.  No significant bony finding.  IMPRESSION: Pan colitis.  This could be due to inflammatory bowel disease or infectious colitis.  Diffuse wall thickening and irregularity and mild stranding of the pericolic fat.  No sign of perforation or abscess.  Original Report Authenticated By: Thomasenia Sales, M.D.    Microbiology: Recent Results (from the past 240 hour(s))  CLOSTRIDIUM DIFFICILE BY PCR     Status: Abnormal   Collection Time   08/01/11  1:10 AM      Component Value Range Status Comment   C difficile by pcr POSITIVE (*) NEGATIVE  Final      Labs: Results for orders placed during the hospital encounter of 07/31/11  CBC      Component Value Range   WBC 10.7 (*) 4.0 - 10.5 (K/uL)   RBC 4.54   3.87 - 5.11 (MIL/uL)   Hemoglobin 13.3  12.0 - 15.0 (g/dL)   HCT 16.1  09.6 - 04.5 (%)   MCV 84.6  78.0 - 100.0 (fL)   MCH 29.3  26.0 - 34.0 (pg)   MCHC 34.6  30.0 - 36.0 (g/dL)   RDW 40.9  81.1 - 91.4 (%)   Platelets 362  150 - 400 (K/uL)  DIFFERENTIAL      Component Value Range   Neutrophils Relative 79 (*) 43 - 77 (%)   Neutro Abs 8.5 (*) 1.7 - 7.7 (K/uL)   Lymphocytes Relative 12  12 - 46 (%)   Lymphs Abs 1.3  0.7 - 4.0 (K/uL)   Monocytes Relative 7  3 - 12 (%)   Monocytes Absolute 0.7  0.1 - 1.0 (K/uL)   Eosinophils Relative 2  0 - 5 (%)  Eosinophils Absolute 0.2  0.0 - 0.7 (K/uL)   Basophils Relative 0  0 - 1 (%)   Basophils Absolute 0.0  0.0 - 0.1 (K/uL)  COMPREHENSIVE METABOLIC PANEL      Component Value Range   Sodium 139  135 - 145 (mEq/L)   Potassium 3.6  3.5 - 5.1 (mEq/L)   Chloride 103  96 - 112 (mEq/L)   CO2 23  19 - 32 (mEq/L)   Glucose, Bld 73  70 - 99 (mg/dL)   BUN 8  6 - 23 (mg/dL)   Creatinine, Ser 1.61  0.50 - 1.10 (mg/dL)   Calcium 8.8  8.4 - 09.6 (mg/dL)   Total Protein 6.7  6.0 - 8.3 (g/dL)   Albumin 3.3 (*) 3.5 - 5.2 (g/dL)   AST 15  0 - 37 (U/L)   ALT 11  0 - 35 (U/L)   Alkaline Phosphatase 77  39 - 117 (U/L)   Total Bilirubin 0.3  0.3 - 1.2 (mg/dL)   GFR calc non Af Amer >90  >90 (mL/min)   GFR calc Af Amer >90  >90 (mL/min)  URINALYSIS, ROUTINE W REFLEX MICROSCOPIC      Component Value Range   Color, Urine AMBER (*) YELLOW    APPearance TURBID (*) CLEAR    Specific Gravity, Urine 1.031 (*) 1.005 - 1.030    pH 5.5  5.0 - 8.0    Glucose, UA NEGATIVE  NEGATIVE (mg/dL)   Hgb urine dipstick NEGATIVE  NEGATIVE    Bilirubin Urine SMALL (*) NEGATIVE    Ketones, ur 15 (*) NEGATIVE (mg/dL)   Protein, ur NEGATIVE  NEGATIVE (mg/dL)   Urobilinogen, UA 0.2  0.0 - 1.0 (mg/dL)   Nitrite NEGATIVE  NEGATIVE    Leukocytes, UA NEGATIVE  NEGATIVE   URINE MICROSCOPIC-ADD ON      Component Value Range   Squamous Epithelial / LPF RARE  RARE    WBC, UA 0-2   <3 (WBC/hpf)   RBC / HPF 0-2  <3 (RBC/hpf)   Bacteria, UA MANY (*) RARE    Urine-Other MUCOUS PRESENT    SEDIMENTATION RATE      Component Value Range   Sed Rate 10  0 - 22 (mm/hr)  C-REACTIVE PROTEIN      Component Value Range   CRP 1.24 (*) <0.60 (mg/dL)  CLOSTRIDIUM DIFFICILE BY PCR      Component Value Range   C difficile by pcr POSITIVE (*) NEGATIVE   BASIC METABOLIC PANEL      Component Value Range   Sodium 135  135 - 145 (mEq/L)   Potassium 3.5  3.5 - 5.1 (mEq/L)   Chloride 110  96 - 112 (mEq/L)   CO2 19  19 - 32 (mEq/L)   Glucose, Bld 111 (*) 70 - 99 (mg/dL)   BUN 4 (*) 6 - 23 (mg/dL)   Creatinine, Ser 0.45  0.50 - 1.10 (mg/dL)   Calcium 8.0 (*) 8.4 - 10.5 (mg/dL)   GFR calc non Af Amer >90  >90 (mL/min)   GFR calc Af Amer >90  >90 (mL/min)  CBC      Component Value Range   WBC 13.8 (*) 4.0 - 10.5 (K/uL)   RBC 4.09  3.87 - 5.11 (MIL/uL)   Hemoglobin 11.5 (*) 12.0 - 15.0 (g/dL)   HCT 40.9 (*) 81.1 - 46.0 (%)   MCV 85.1  78.0 - 100.0 (fL)   MCH 28.1  26.0 - 34.0 (pg)   MCHC 33.0  30.0 - 36.0 (g/dL)   RDW 47.8  29.5 - 62.1 (%)   Platelets 343  150 - 400 (K/uL)  DIFFERENTIAL      Component Value Range   Neutrophils Relative 85 (*) 43 - 77 (%)   Neutro Abs 11.7 (*) 1.7 - 7.7 (K/uL)   Lymphocytes Relative 6 (*) 12 - 46 (%)   Lymphs Abs 0.8  0.7 - 4.0 (K/uL)   Monocytes Relative 7  3 - 12 (%)   Monocytes Absolute 0.9  0.1 - 1.0 (K/uL)   Eosinophils Relative 2  0 - 5 (%)   Eosinophils Absolute 0.3  0.0 - 0.7 (K/uL)   Basophils Relative 0  0 - 1 (%)   Basophils Absolute 0.0  0.0 - 0.1 (K/uL)  TSH      Component Value Range   TSH 6.781 (*) 0.350 - 4.500 (uIU/mL)  TSH      Component Value Range   TSH 5.656 (*) 0.350 - 4.500 (uIU/mL)  CBC      Component Value Range   WBC 9.0  4.0 - 10.5 (K/uL)   RBC 3.76 (*) 3.87 - 5.11 (MIL/uL)   Hemoglobin 11.1 (*) 12.0 - 15.0 (g/dL)   HCT 30.8 (*) 65.7 - 46.0 (%)   MCV 85.4  78.0 - 100.0 (fL)   MCH 29.5  26.0 - 34.0 (pg)    MCHC 34.6  30.0 - 36.0 (g/dL)   RDW 84.6  96.2 - 95.2 (%)   Platelets 345  150 - 400 (K/uL)  DIFFERENTIAL      Component Value Range   Neutrophils Relative 81 (*) 43 - 77 (%)   Neutro Abs 7.3  1.7 - 7.7 (K/uL)   Lymphocytes Relative 8 (*) 12 - 46 (%)   Lymphs Abs 0.8  0.7 - 4.0 (K/uL)   Monocytes Relative 7  3 - 12 (%)   Monocytes Absolute 0.6  0.1 - 1.0 (K/uL)   Eosinophils Relative 3  0 - 5 (%)   Eosinophils Absolute 0.3  0.0 - 0.7 (K/uL)   Basophils Relative 1  0 - 1 (%)   Basophils Absolute 0.1  0.0 - 0.1 (K/uL)  BASIC METABOLIC PANEL      Component Value Range   Sodium 136  135 - 145 (mEq/L)   Potassium 3.4 (*) 3.5 - 5.1 (mEq/L)   Chloride 107  96 - 112 (mEq/L)   CO2 20  19 - 32 (mEq/L)   Glucose, Bld 90  70 - 99 (mg/dL)   BUN 5 (*) 6 - 23 (mg/dL)   Creatinine, Ser 8.41  0.50 - 1.10 (mg/dL)   Calcium 7.7 (*) 8.4 - 10.5 (mg/dL)   GFR calc non Af Amer >90  >90 (mL/min)   GFR calc Af Amer >90  >90 (mL/min)  T4, FREE      Component Value Range   Free T4 1.07  0.80 - 1.80 (ng/dL)  TSH      Component Value Range   TSH 7.603 (*) 0.350 - 4.500 (uIU/mL)     Signed: Amilliana Hayworth Bailey 08/07/2011, 6:48 AM

## 2011-09-10 ENCOUNTER — Other Ambulatory Visit: Payer: Self-pay | Admitting: Gynecology

## 2011-09-10 DIAGNOSIS — N63 Unspecified lump in unspecified breast: Secondary | ICD-10-CM

## 2011-09-17 ENCOUNTER — Ambulatory Visit
Admission: RE | Admit: 2011-09-17 | Discharge: 2011-09-17 | Disposition: A | Discharge status: Home / Self Care | Payer: Medicare Other | Source: Ambulatory Visit | Attending: Gynecology | Admitting: Gynecology

## 2011-09-17 DIAGNOSIS — N63 Unspecified lump in unspecified breast: Secondary | ICD-10-CM

## 2011-10-03 ENCOUNTER — Ambulatory Visit: Payer: Medicare Other

## 2011-10-03 ENCOUNTER — Ambulatory Visit (INDEPENDENT_AMBULATORY_CARE_PROVIDER_SITE_OTHER): Payer: Medicare Other | Admitting: Family Medicine

## 2011-10-03 VITALS — BP 108/74 | HR 74 | Temp 98.0°F | Resp 18

## 2011-10-03 DIAGNOSIS — R059 Cough, unspecified: Secondary | ICD-10-CM

## 2011-10-03 DIAGNOSIS — S069X9A Unspecified intracranial injury with loss of consciousness of unspecified duration, initial encounter: Secondary | ICD-10-CM

## 2011-10-03 DIAGNOSIS — R05 Cough: Secondary | ICD-10-CM

## 2011-10-03 DIAGNOSIS — S069XAA Unspecified intracranial injury with loss of consciousness status unknown, initial encounter: Secondary | ICD-10-CM

## 2011-10-03 DIAGNOSIS — J40 Bronchitis, not specified as acute or chronic: Secondary | ICD-10-CM

## 2011-10-03 LAB — POCT CBC
Granulocyte percent: 71.2 %G (ref 37–80)
HCT, POC: 37.5 % — AB (ref 37.7–47.9)
Lymph, poc: 1.4 (ref 0.6–3.4)
MCHC: 32.3 g/dL (ref 31.8–35.4)
MCV: 87.2 fL (ref 80–97)
MID (cbc): 0.4 (ref 0–0.9)
POC LYMPH PERCENT: 21.9 %L (ref 10–50)
Platelet Count, POC: 357 10*3/uL (ref 142–424)
RDW, POC: 14.1 %

## 2011-10-03 MED ORDER — HYDROCODONE-HOMATROPINE 5-1.5 MG/5ML PO SYRP: 5.0000 mL | ORAL_SOLUTION | Freq: Three times a day (TID) | ORAL | Status: DC | PRN

## 2011-10-03 MED ORDER — BENZONATATE 100 MG PO CAPS: 100.0000 mg | ORAL_CAPSULE | Freq: Three times a day (TID) | ORAL | Status: AC | PRN

## 2011-10-03 MED ORDER — AZITHROMYCIN 250 MG PO TABS: ORAL_TABLET | ORAL | Status: AC
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
## 2011-10-03 NOTE — Patient Instructions (Signed)

## 2011-10-13 ENCOUNTER — Encounter (HOSPITAL_COMMUNITY): Payer: Self-pay | Admitting: *Deleted

## 2011-10-13 ENCOUNTER — Emergency Department (HOSPITAL_COMMUNITY): Payer: Medicare Other

## 2011-10-13 ENCOUNTER — Emergency Department (HOSPITAL_COMMUNITY)
Admission: EM | Admit: 2011-10-13 | Discharge: 2011-10-14 | Disposition: A | Discharge status: Home / Self Care | Payer: Medicare Other | Attending: Emergency Medicine | Admitting: Emergency Medicine

## 2011-10-13 DIAGNOSIS — R05 Cough: Secondary | ICD-10-CM | POA: Insufficient documentation

## 2011-10-13 DIAGNOSIS — R059 Cough, unspecified: Secondary | ICD-10-CM

## 2011-10-13 DIAGNOSIS — R0982 Postnasal drip: Secondary | ICD-10-CM

## 2011-10-13 DIAGNOSIS — Z87820 Personal history of traumatic brain injury: Secondary | ICD-10-CM | POA: Insufficient documentation

## 2011-10-13 MED ORDER — BENZONATATE 100 MG PO CAPS
100.0000 mg | ORAL_CAPSULE | Freq: Once | ORAL | Status: AC
  Administered 2011-10-13: 100 mg via ORAL
  Filled 2011-10-13: qty 1

## 2011-10-13 MED ORDER — BENZONATATE 100 MG PO CAPS: 100.0000 mg | ORAL_CAPSULE | Freq: Three times a day (TID) | ORAL | Status: AC
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
## 2011-10-13 NOTE — ED Notes (Signed)
ZOX:[id number]<VW> Expected date:10/13/11<BR> Expected time: 9:20 PM<BR> Means of arrival:Ambulance<BR> Comments:<BR> EMS, cough, move to rm 1 ASAP.

## 2011-10-13 NOTE — ED Notes (Signed)
Pt reports non-productive, dry cough "for a while now" pt states she has seen her PCP for this and was told she needed an rx z-pack however pt did not take this, Pt states she has been using vicks and dayquil w/ relief however she did not want to take additional of these medications for concern for her liver. Pt appears to have some MR w/ slow speech however is A&Ox4 at present. Pt in no acute distress, resp even and unlabored w/ dry consistent cough.

## 2011-10-13 NOTE — ED Notes (Signed)
Per EMS - pt w/ non-productive cough x1 week, pt was seen by PCP and given Rx for z-pack which pt did not fill or take. Pt w/ continued cough and shortness of breath, has been taking OTC medications w/o relief. Pt feels warm to touch to EMS.

## 2011-10-13 NOTE — Discharge Instructions (Signed)
Your chest x-ray did not show any concerning signs. You were given a prescription for medications to help with your cough. Please followup with your primary care provider.  Cough, Adult  A cough is a reflex. It helps you clear your throat and airways. A cough can help heal your body. A cough can last 2 or 3 weeks (acute) or may last more than 8 weeks (chronic). Some common causes of a cough can include an infection, allergy, or a cold. HOME CARE  Only take medicine as told by your doctor.   If given, take your medicines (antibiotics) as told. Finish them even if you start to feel better.   Use a cold steam vaporizer or humidier in your home. This can help loosen thick spit (secretions).   Sleep so you are almost sitting up (semi-upright). Use pillows to do this. This helps reduce coughing.   Rest as needed.   Stop smoking if you smoke.  GET HELP RIGHT AWAY IF:  You have yellowish-white fluid (pus) in your thick spit.   Your cough gets worse.   Your medicine does not reduce coughing, and you are losing sleep.   You cough up blood.   You have trouble breathing.   Your pain gets worse and medicine does not help.   You have a fever.  MAKE SURE YOU:   Understand these instructions.   Will watch your condition.   Will get help right away if you are not doing well or get worse.  Document Released: 03/28/2011 Document Revised: 07/04/2011 Document Reviewed: 03/28/2011 [facility] Patient Information 2012 [place], [place].

## 2011-10-14 NOTE — ED Notes (Signed)
Patient given discharge instructions, information, prescriptions, and diet order. Patient states that they adequately understand discharge information given and to return to ED if symptoms return or worsen.    Report called to [facility] by Sharrie Rothman RN. [facility] on the way to take patient to her facility.

## 2011-10-16 NOTE — ED Provider Notes (Signed)
Medical screening examination/treatment/procedure(s) were performed by non-physician practitioner and as supervising physician I was immediately available for consultation/collaboration.   Loren Racer, MD 10/16/11 [phone]

## 2012-01-06 ENCOUNTER — Emergency Department (HOSPITAL_COMMUNITY)
Admission: EM | Admit: 2012-01-06 | Discharge: 2012-01-06 | Disposition: A | Discharge status: Home / Self Care | Payer: Medicare Other | Attending: Emergency Medicine | Admitting: Emergency Medicine

## 2012-01-06 ENCOUNTER — Encounter (HOSPITAL_COMMUNITY): Payer: Self-pay | Admitting: Emergency Medicine

## 2012-01-06 DIAGNOSIS — W010XXA Fall on same level from slipping, tripping and stumbling without subsequent striking against object, initial encounter: Secondary | ICD-10-CM | POA: Insufficient documentation

## 2012-01-06 DIAGNOSIS — Y92009 Unspecified place in unspecified non-institutional (private) residence as the place of occurrence of the external cause: Secondary | ICD-10-CM | POA: Insufficient documentation

## 2012-01-06 DIAGNOSIS — Z87820 Personal history of traumatic brain injury: Secondary | ICD-10-CM | POA: Insufficient documentation

## 2012-01-06 DIAGNOSIS — S0100XA Unspecified open wound of scalp, initial encounter: Secondary | ICD-10-CM | POA: Insufficient documentation

## 2012-01-06 DIAGNOSIS — S0191XA Laceration without foreign body of unspecified part of head, initial encounter: Secondary | ICD-10-CM
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
## 2012-01-06 NOTE — ED Notes (Signed)
Pt was getting dressed this am and lost her balance and fell backwards, hitting the back of her head on the floor.  Pt denies losing consciousness, denies pain.  Pt with approx 1.5" laceration to back of head, bleeding controlled at this time. Pt denies any other injuries.

## 2012-01-06 NOTE — ED Notes (Signed)
MD at bedside to repair laceration

## 2012-01-06 NOTE — ED Provider Notes (Signed)
  I performed a history and physical examination of Candace Bailey and discussed her management with Dr. Cristal Ford.  I agree with the history, physical, assessment, and plan of care, with the following exceptions: None  This very pleasant female with history of TBI, now presents following a fall.  On exam she is in no distress, chief is back to her baseline.  There are no notable neurovascular deficits, and the patient had wound repaired as documented above.  The procedure was well tolerated and the patient was discharged in stable condition.  Elyse Jarvis, MD 01/06/12 [phone]

## 2012-01-06 NOTE — ED Notes (Signed)
ZOX:[id number]<AV> Expected date:01/06/12<BR> Expected time: 8:18 AM<BR> Means of arrival:Ambulance<BR> Comments:<BR> fall

## 2012-03-06 ENCOUNTER — Emergency Department (HOSPITAL_COMMUNITY)
Admission: EM | Admit: 2012-03-06 | Discharge: 2012-03-06 | Disposition: A | Discharge status: Home / Self Care | Payer: Medicare Other | Attending: Emergency Medicine | Admitting: Emergency Medicine

## 2012-03-06 ENCOUNTER — Emergency Department (HOSPITAL_COMMUNITY): Payer: Medicare Other

## 2012-03-06 DIAGNOSIS — M51379 Other intervertebral disc degeneration, lumbosacral region without mention of lumbar back pain or lower extremity pain: Secondary | ICD-10-CM | POA: Insufficient documentation

## 2012-03-06 DIAGNOSIS — M5137 Other intervertebral disc degeneration, lumbosacral region: Secondary | ICD-10-CM | POA: Insufficient documentation

## 2012-03-06 DIAGNOSIS — M5136 Other intervertebral disc degeneration, lumbar region: Secondary | ICD-10-CM

## 2012-03-06 MED ORDER — IBUPROFEN 800 MG PO TABS
800.0000 mg | ORAL_TABLET | Freq: Once | ORAL | Status: AC
  Administered 2012-03-06: 800 mg via ORAL
  Filled 2012-03-06: qty 1

## 2012-03-06 MED ORDER — KETOROLAC TROMETHAMINE 30 MG/ML IJ SOLN: 30.0000 mg | Freq: Once | INTRAMUSCULAR | Status: DC

## 2012-03-06 MED ORDER — HYDROCODONE-ACETAMINOPHEN 5-500 MG PO TABS: 1.0000 | ORAL_TABLET | Freq: Four times a day (QID) | ORAL | Status: AC | PRN

## 2012-03-06 MED ORDER — IBUPROFEN 600 MG PO TABS: 600.0000 mg | ORAL_TABLET | Freq: Four times a day (QID) | ORAL | Status: AC | PRN

## 2012-03-06 NOTE — ED Notes (Signed)
[facility] called  

## 2012-03-06 NOTE — ED Provider Notes (Signed)
I saw and evaluated the patient, reviewed the resident's note and I agree with the findings and plan.  The patient has back pain which is most consistent with musculoskeletal back pain.  Her plain films demonstrate lumbar degenerative disc disease.  She has no weakness in her lower extremities.  Her ambulation is baseline for her.  Lyanne Co, MD 03/06/12 [phone]

## 2012-03-06 NOTE — ED Notes (Signed)
Pt c/o lower back pain x 3 days worsen with movement and standing position.

## 2012-03-06 NOTE — ED Notes (Signed)
NWG:[id number]<ZH> Expected date:03/06/12<BR> Expected time: 7:09 AM<BR> Means of arrival:Ambulance<BR> Comments:<BR> Back pain

## 2012-03-06 NOTE — ED Notes (Signed)
Pt is going back home by [facility]
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
## 2012-07-30 ENCOUNTER — Encounter (HOSPITAL_COMMUNITY): Payer: Self-pay | Admitting: Emergency Medicine

## 2012-07-30 ENCOUNTER — Emergency Department (HOSPITAL_COMMUNITY)
Admission: EM | Admit: 2012-07-30 | Discharge: 2012-07-30 | Disposition: A | Discharge status: Home / Self Care | Payer: Medicare Other | Attending: Emergency Medicine | Admitting: Emergency Medicine

## 2012-07-30 ENCOUNTER — Emergency Department (HOSPITAL_COMMUNITY): Payer: Medicare Other

## 2012-07-30 DIAGNOSIS — Z87820 Personal history of traumatic brain injury: Secondary | ICD-10-CM | POA: Insufficient documentation

## 2012-07-30 DIAGNOSIS — W010XXA Fall on same level from slipping, tripping and stumbling without subsequent striking against object, initial encounter: Secondary | ICD-10-CM | POA: Insufficient documentation

## 2012-07-30 DIAGNOSIS — L309 Dermatitis, unspecified: Secondary | ICD-10-CM

## 2012-07-30 DIAGNOSIS — W19XXXA Unspecified fall, initial encounter: Secondary | ICD-10-CM

## 2012-07-30 DIAGNOSIS — Y9389 Activity, other specified: Secondary | ICD-10-CM | POA: Insufficient documentation

## 2012-07-30 DIAGNOSIS — R5383 Other fatigue: Secondary | ICD-10-CM | POA: Insufficient documentation

## 2012-07-30 DIAGNOSIS — S8990XA Unspecified injury of unspecified lower leg, initial encounter: Secondary | ICD-10-CM | POA: Insufficient documentation

## 2012-07-30 DIAGNOSIS — Z79899 Other long term (current) drug therapy: Secondary | ICD-10-CM | POA: Insufficient documentation

## 2012-07-30 DIAGNOSIS — Y92410 Unspecified street and highway as the place of occurrence of the external cause: Secondary | ICD-10-CM | POA: Insufficient documentation

## 2012-07-30 DIAGNOSIS — R5381 Other malaise: Secondary | ICD-10-CM | POA: Insufficient documentation

## 2012-07-30 DIAGNOSIS — L259 Unspecified contact dermatitis, unspecified cause: Secondary | ICD-10-CM | POA: Insufficient documentation

## 2012-07-30 DIAGNOSIS — S99919A Unspecified injury of unspecified ankle, initial encounter: Secondary | ICD-10-CM | POA: Insufficient documentation

## 2012-07-30 MED ORDER — HYDROCORTISONE 1 % EX CREA: TOPICAL_CREAM | CUTANEOUS | Status: DC

## 2012-07-30 NOTE — ED Provider Notes (Signed)
Medical screening examination/treatment/procedure(s) were performed by non-physician practitioner and as supervising physician I was immediately available for consultation/collaboration.   Alyanna Stoermer, MD 07/30/12 2331 
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
## 2012-07-30 NOTE — ED Notes (Signed)
Pt refusing to have vital signs checked.  °
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
## 2012-07-30 NOTE — ED Notes (Signed)
Patient screaming when approached by nurse.  Pt has history of brain damage.  Difficult to understand patient's verbalizations.

## 2013-05-24 ENCOUNTER — Encounter (HOSPITAL_COMMUNITY): Payer: Self-pay | Admitting: Radiology

## 2013-05-24 ENCOUNTER — Emergency Department (HOSPITAL_COMMUNITY): Payer: Medicare Other

## 2013-05-24 ENCOUNTER — Emergency Department (HOSPITAL_COMMUNITY)
Admission: EM | Admit: 2013-05-24 | Discharge: 2013-05-24 | Disposition: A | Discharge status: Home / Self Care | Payer: Medicare Other | Attending: Emergency Medicine | Admitting: Emergency Medicine

## 2013-05-24 DIAGNOSIS — W1809XA Striking against other object with subsequent fall, initial encounter: Secondary | ICD-10-CM

## 2013-05-24 DIAGNOSIS — Y9289 Other specified places as the place of occurrence of the external cause: Secondary | ICD-10-CM | POA: Insufficient documentation

## 2013-05-24 DIAGNOSIS — Y9389 Activity, other specified: Secondary | ICD-10-CM | POA: Insufficient documentation

## 2013-05-24 DIAGNOSIS — S01112A Laceration without foreign body of left eyelid and periocular area, initial encounter: Secondary | ICD-10-CM

## 2013-05-24 DIAGNOSIS — Z87820 Personal history of traumatic brain injury: Secondary | ICD-10-CM | POA: Insufficient documentation

## 2013-05-24 DIAGNOSIS — W010XXA Fall on same level from slipping, tripping and stumbling without subsequent striking against object, initial encounter: Secondary | ICD-10-CM | POA: Insufficient documentation

## 2013-05-24 DIAGNOSIS — W01119A Fall on same level from slipping, tripping and stumbling with subsequent striking against unspecified sharp object, initial encounter: Secondary | ICD-10-CM | POA: Insufficient documentation

## 2013-05-24 DIAGNOSIS — S0180XA Unspecified open wound of other part of head, initial encounter: Secondary | ICD-10-CM | POA: Insufficient documentation

## 2013-05-24 NOTE — ED Notes (Signed)
Pt present to ED with c/o fall.  Pt present with dressing by EMS to head.  Pt reports "i hit my head on floor, I am mad and I want to go back and get my sandwich."  Pt aaox3.  Pt denies LOC.  Pt hx TBI.  Pt reprots "laces was untied."  Pt wears left boot and ambulates with walker.  Pt dressing dry and intact
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
## 2013-05-24 NOTE — ED Provider Notes (Signed)
Medical screening examination/treatment/procedure(s) were performed by non-physician practitioner and as supervising physician I was immediately available for consultation/collaboration.  EKG Interpretation   None        Raeford Razor, MD 05/24/13 1605

## 2013-05-24 NOTE — ED Notes (Signed)
Bed: WA01 Expected date:  Expected time:  Means of arrival:  Comments: M90 - Fall Head Lac

## 2014-10-03 ENCOUNTER — Emergency Department (HOSPITAL_COMMUNITY)
Admission: EM | Admit: 2014-10-03 | Discharge: 2014-10-03 | Disposition: A | Discharge status: Home / Self Care | Payer: Medicare Other | Attending: Emergency Medicine | Admitting: Emergency Medicine

## 2014-10-03 ENCOUNTER — Encounter (HOSPITAL_COMMUNITY): Payer: Self-pay | Admitting: Emergency Medicine

## 2014-10-03 DIAGNOSIS — Z87820 Personal history of traumatic brain injury: Secondary | ICD-10-CM | POA: Insufficient documentation

## 2014-10-03 DIAGNOSIS — Y9289 Other specified places as the place of occurrence of the external cause: Secondary | ICD-10-CM | POA: Diagnosis not present

## 2014-10-03 DIAGNOSIS — S0990XA Unspecified injury of head, initial encounter: Secondary | ICD-10-CM | POA: Diagnosis present

## 2014-10-03 DIAGNOSIS — Y998 Other external cause status: Secondary | ICD-10-CM | POA: Insufficient documentation

## 2014-10-03 DIAGNOSIS — W1839XA Other fall on same level, initial encounter: Secondary | ICD-10-CM | POA: Insufficient documentation

## 2014-10-03 DIAGNOSIS — T07XXXA Unspecified multiple injuries, initial encounter: Secondary | ICD-10-CM

## 2014-10-03 DIAGNOSIS — S0101XA Laceration without foreign body of scalp, initial encounter: Secondary | ICD-10-CM | POA: Insufficient documentation

## 2014-10-03 DIAGNOSIS — Y9389 Activity, other specified: Secondary | ICD-10-CM | POA: Diagnosis not present
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
## 2014-10-03 NOTE — Discharge Instructions (Signed)
You have been evaluated for your recent fall.  No evidence of retained broken glass in your skin.  Follow instruction below.   Abrasion An abrasion is a cut or scrape of the skin. Abrasions do not extend through all layers of the skin and most heal within 10 days. It is important to care for your abrasion properly to prevent infection. CAUSES  Most abrasions are caused by falling on, or gliding across, the ground or other surface. When your skin rubs on something, the outer and inner layer of skin rubs off, causing an abrasion. DIAGNOSIS  Your caregiver will be able to diagnose an abrasion during a physical exam.  TREATMENT  Your treatment depends on how large and deep the abrasion is. Generally, your abrasion will be cleaned with water and a mild soap to remove any dirt or debris. An antibiotic ointment may be put over the abrasion to prevent an infection. A bandage (dressing) may be wrapped around the abrasion to keep it from getting dirty.  You may need a tetanus shot if:  You cannot remember when you had your last tetanus shot.  You have never had a tetanus shot.  The injury broke your skin. If you get a tetanus shot, your arm may swell, get red, and feel warm to the touch. This is common and not a problem. If you need a tetanus shot and you choose not to have one, there is a rare chance of getting tetanus. Sickness from tetanus can be serious.  HOME CARE INSTRUCTIONS   If a dressing was applied, change it at least once a day or as directed by your caregiver. If the bandage sticks, soak it off with warm water.   Wash the area with water and a mild soap to remove all the ointment 2 times a day. Rinse off the soap and pat the area dry with a clean towel.   Reapply any ointment as directed by your caregiver. This will help prevent infection and keep the bandage from sticking. Use gauze over the wound and under the dressing to help keep the bandage from sticking.   Change your dressing  right away if it becomes wet or dirty.   Only take over-the-counter or prescription medicines for pain, discomfort, or fever as directed by your caregiver.   Follow up with your caregiver within 24-48 hours for a wound check, or as directed. If you were not given a wound-check appointment, look closely at your abrasion for redness, swelling, or pus. These are signs of infection. SEEK IMMEDIATE MEDICAL CARE IF:   You have increasing pain in the wound.   You have redness, swelling, or tenderness around the wound.   You have pus coming from the wound.   You have a fever or persistent symptoms for more than 2-3 days.  You have a fever and your symptoms suddenly get worse.  You have a bad smell coming from the wound or dressing.  MAKE SURE YOU:   Understand these instructions.  Will watch your condition.  Will get help right away if you are not doing well or get worse. Document Released: 04/24/2005 Document Revised: 07/01/2012 Document Reviewed: 06/18/2011 [facility] Patient Information 2015 [place], [place]. This information is not intended to replace advice given to you by your health care provider. Make sure you discuss any questions you have with your health care provider.
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
## 2015-01-15 ENCOUNTER — Encounter (HOSPITAL_COMMUNITY): Payer: Self-pay | Admitting: Emergency Medicine

## 2015-01-15 ENCOUNTER — Emergency Department (HOSPITAL_COMMUNITY)
Admission: EM | Admit: 2015-01-15 | Discharge: 2015-01-15 | Disposition: A | Discharge status: Home / Self Care | Payer: Medicare Other | Attending: Emergency Medicine | Admitting: Emergency Medicine

## 2015-01-15 DIAGNOSIS — Y939 Activity, unspecified: Secondary | ICD-10-CM | POA: Insufficient documentation

## 2015-01-15 DIAGNOSIS — W19XXXA Unspecified fall, initial encounter: Secondary | ICD-10-CM

## 2015-01-15 DIAGNOSIS — W1839XA Other fall on same level, initial encounter: Secondary | ICD-10-CM | POA: Diagnosis not present

## 2015-01-15 DIAGNOSIS — S8991XA Unspecified injury of right lower leg, initial encounter: Secondary | ICD-10-CM | POA: Diagnosis not present

## 2015-01-15 DIAGNOSIS — Y92000 Kitchen of unspecified non-institutional (private) residence as  the place of occurrence of the external cause: Secondary | ICD-10-CM | POA: Insufficient documentation

## 2015-01-15 DIAGNOSIS — Y999 Unspecified external cause status: Secondary | ICD-10-CM | POA: Insufficient documentation

## 2015-01-15 DIAGNOSIS — S4991XA Unspecified injury of right shoulder and upper arm, initial encounter: Secondary | ICD-10-CM | POA: Insufficient documentation

## 2015-01-15 NOTE — ED Notes (Signed)
Bed: WA03 Expected date:  Expected time:  Means of arrival:  Comments: 44 yo female, fall due to being tired, no injuries

## 2015-01-15 NOTE — ED Notes (Signed)
[facility] called for transport home. 
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
## 2015-01-15 NOTE — Discharge Instructions (Signed)

## 2015-01-15 NOTE — ED Notes (Signed)
Pt coming from home by herself. Well known to EMS. Called EMS today for fall. No complaints. Hx of TBI. Alert and oriented per norm.

## 2015-03-13 ENCOUNTER — Encounter (HOSPITAL_COMMUNITY): Payer: Self-pay | Admitting: Emergency Medicine

## 2015-03-13 ENCOUNTER — Emergency Department (HOSPITAL_COMMUNITY)
Admission: EM | Admit: 2015-03-13 | Discharge: 2015-03-14 | Disposition: A | Discharge status: Home / Self Care | Payer: Medicare Other | Attending: Emergency Medicine | Admitting: Emergency Medicine

## 2015-03-13 DIAGNOSIS — Z87820 Personal history of traumatic brain injury: Secondary | ICD-10-CM | POA: Insufficient documentation

## 2015-03-13 DIAGNOSIS — R197 Diarrhea, unspecified: Secondary | ICD-10-CM | POA: Diagnosis present

## 2015-03-13 NOTE — ED Notes (Signed)
Pt arrived to the ED with a complaint of diarrhea.  Pt iniitally called EMS for a fall but when they arrived she stated she had diarrhea and it was uncontrollable.  Pt has had one episode of diarrhea today

## 2015-03-14 LAB — COMPREHENSIVE METABOLIC PANEL
ALT: 19 U/L (ref 14–54)
ANION GAP: 7 (ref 5–15)
AST: 19 U/L (ref 15–41)
Albumin: 3.9 g/dL (ref 3.5–5.0)
Alkaline Phosphatase: 71 U/L (ref 38–126)
BILIRUBIN TOTAL: 0.3 mg/dL (ref 0.3–1.2)
BUN: 17 mg/dL (ref 6–20)
CHLORIDE: 106 mmol/L (ref 101–111)
CO2: 26 mmol/L (ref 22–32)
Calcium: 9.1 mg/dL (ref 8.9–10.3)
Creatinine, Ser: 0.74 mg/dL (ref 0.44–1.00)
Glucose, Bld: 102 mg/dL — ABNORMAL HIGH (ref 65–99)
POTASSIUM: 4.1 mmol/L (ref 3.5–5.1)
Sodium: 139 mmol/L (ref 135–145)
TOTAL PROTEIN: 7.8 g/dL (ref 6.5–8.1)

## 2015-03-14 LAB — LIPASE, BLOOD: LIPASE: 26 U/L (ref 22–51)

## 2015-03-14 LAB — CBC
HEMATOCRIT: 36.2 % (ref 36.0–46.0)
HEMOGLOBIN: 12.3 g/dL (ref 12.0–15.0)
MCH: 29.8 pg (ref 26.0–34.0)
MCHC: 34 g/dL (ref 30.0–36.0)
MCV: 87.7 fL (ref 78.0–100.0)
Platelets: 260 10*3/uL (ref 150–400)
RBC: 4.13 MIL/uL (ref 3.87–5.11)
RDW: 12.6 % (ref 11.5–15.5)
WBC: 8.5 10*3/uL (ref 4.0–10.5)

## 2015-03-14 MED ORDER — LOPERAMIDE HCL 2 MG PO CAPS
4.0000 mg | ORAL_CAPSULE | Freq: Once | ORAL | Status: AC
  Administered 2015-03-14: 4 mg via ORAL
  Filled 2015-03-14: qty 2

## 2015-03-14 MED ORDER — LOPERAMIDE HCL 2 MG PO CAPS: 2.0000 mg | ORAL_CAPSULE | Freq: Four times a day (QID) | ORAL | Status: DC | PRN

## 2015-03-14 NOTE — Discharge Instructions (Signed)
Take imodium as needed for diarrhea. Refer to attached documents for more information.
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
## 2015-03-14 NOTE — ED Notes (Signed)
Bed: WHALF Expected date:  Expected time:  Means of arrival:  Comments: 

## 2015-08-12 ENCOUNTER — Encounter (HOSPITAL_COMMUNITY): Payer: Self-pay | Admitting: *Deleted

## 2015-08-12 ENCOUNTER — Emergency Department (HOSPITAL_COMMUNITY): Payer: Medicare Other

## 2015-08-12 ENCOUNTER — Emergency Department (HOSPITAL_COMMUNITY)
Admission: EM | Admit: 2015-08-12 | Discharge: 2015-08-12 | Disposition: A | Discharge status: Home / Self Care | Payer: Medicare Other | Attending: Emergency Medicine | Admitting: Emergency Medicine

## 2015-08-12 DIAGNOSIS — W01198A Fall on same level from slipping, tripping and stumbling with subsequent striking against other object, initial encounter: Secondary | ICD-10-CM | POA: Diagnosis not present

## 2015-08-12 DIAGNOSIS — S0990XA Unspecified injury of head, initial encounter: Secondary | ICD-10-CM | POA: Diagnosis present

## 2015-08-12 DIAGNOSIS — Z87820 Personal history of traumatic brain injury: Secondary | ICD-10-CM | POA: Diagnosis not present

## 2015-08-12 DIAGNOSIS — Y92009 Unspecified place in unspecified non-institutional (private) residence as the place of occurrence of the external cause: Secondary | ICD-10-CM | POA: Diagnosis not present

## 2015-08-12 DIAGNOSIS — S0101XA Laceration without foreign body of scalp, initial encounter: Secondary | ICD-10-CM | POA: Insufficient documentation

## 2015-08-12 DIAGNOSIS — Y9389 Activity, other specified: Secondary | ICD-10-CM | POA: Insufficient documentation

## 2015-08-12 DIAGNOSIS — Y998 Other external cause status: Secondary | ICD-10-CM | POA: Insufficient documentation

## 2015-08-12 DIAGNOSIS — W19XXXA Unspecified fall, initial encounter: Secondary | ICD-10-CM

## 2015-08-12 NOTE — ED Notes (Signed)
Bed: WHALA Expected date:  Expected time:  Means of arrival:  Comments: 
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
## 2015-08-12 NOTE — ED Notes (Signed)
Patient lives at home alone and has history of traumatic brain injury and frequent falls. Patient is suppose to ambulate with her walker which she decided not to do today. She fell backwards and now has a laceration to the back of her head. Patient speaks slowly and has tremors, but this is baseline for her.

## 2015-08-12 NOTE — ED Notes (Signed)
Bed: [id number] Expected date: 08/12/15 Expected time: 11:34 AM Means of arrival: Ambulance Comments: Fall head lac

## 2015-08-12 NOTE — Discharge Instructions (Signed)
Scalp laceration repaired with surgical staples. They will need to be removed in the 7 days. CT of head and neck without any acute findings. Keep the wound dry for the next 24 hours.

## 2015-08-16 ENCOUNTER — Encounter (HOSPITAL_COMMUNITY): Payer: Self-pay | Admitting: Emergency Medicine

## 2015-08-16 ENCOUNTER — Emergency Department (HOSPITAL_COMMUNITY)
Admission: EM | Admit: 2015-08-16 | Discharge: 2015-08-16 | Disposition: A | Discharge status: Home / Self Care | Payer: Medicare Other | Attending: Emergency Medicine | Admitting: Emergency Medicine

## 2015-08-16 DIAGNOSIS — Y998 Other external cause status: Secondary | ICD-10-CM | POA: Insufficient documentation

## 2015-08-16 DIAGNOSIS — Y9389 Activity, other specified: Secondary | ICD-10-CM | POA: Diagnosis not present

## 2015-08-16 DIAGNOSIS — Y92410 Unspecified street and highway as the place of occurrence of the external cause: Secondary | ICD-10-CM | POA: Diagnosis not present

## 2015-08-16 DIAGNOSIS — S0101XA Laceration without foreign body of scalp, initial encounter: Secondary | ICD-10-CM

## 2015-08-16 DIAGNOSIS — S0990XA Unspecified injury of head, initial encounter: Secondary | ICD-10-CM | POA: Diagnosis present

## 2015-08-16 MED ORDER — HYDROGEN PEROXIDE 3 % EX SOLN
CUTANEOUS | Status: AC
  Administered 2015-08-16: 1
  Filled 2015-08-16: qty 473

## 2015-08-16 MED ORDER — LIDOCAINE-EPINEPHRINE (PF) 2 %-1:200000 IJ SOLN
20.0000 mL | Freq: Once | INTRAMUSCULAR | Status: AC
  Administered 2015-08-16: 20 mL

## 2015-08-16 MED ORDER — LIDOCAINE-EPINEPHRINE (PF) 2 %-1:200000 IJ SOLN
INTRAMUSCULAR | Status: AC
  Administered 2015-08-16: 20 mL
  Filled 2015-08-16: qty 20
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
## 2015-08-16 NOTE — ED Notes (Signed)
PT DISCHARGED. INSTRUCTIONS GIVEN. AAOX3. PT IN NO APPARENT DISTRESS OR PAIN. THE OPPORTUNITY TO ASK QUESTIONS WAS PROVIDED. 

## 2015-08-16 NOTE — Discharge Instructions (Signed)
Stitches, Staples, or Adhesive Wound Closure  °Health care providers use stitches (sutures), staples, and certain glue (skin adhesives) to hold skin together while it heals (wound closure). You may need this treatment after you have surgery or if you cut your skin accidentally. These methods help your skin to heal more quickly and make it less likely that you will have a scar. A wound may take several months to heal completely.  °The type of wound you have determines when your wound gets closed. In most cases, the wound is closed as soon as possible (primary skin closure). Sometimes, closure is delayed so the wound can be cleaned and allowed to heal naturally. This reduces the chance of infection. Delayed closure may be needed if your wound:  °Is caused by a bite.  °Happened more than 6 hours ago.  °Involves loss of skin or the tissues under the skin.  °Has dirt or debris in it that cannot be removed.  °Is infected. °WHAT ARE THE DIFFERENT KINDS OF WOUND CLOSURES?  °There are many options for wound closure. The one that your health care provider uses depends on how deep and how large your wound is.  °Adhesive Glue  °To use this type of glue to close a wound, your health care provider holds the edges of the wound together and paints the glue on the surface of your skin. You may need more than one layer of glue. Then the wound may be covered with a light bandage (dressing).  °This type of skin closure may be used for small wounds that are not deep (superficial). Using glue for wound closure is less painful than other methods. It does not require a medicine that numbs the area (local anesthetic). This method also leaves nothing to be removed. Adhesive glue is often used for children and on facial wounds.  °Adhesive glue cannot be used for wounds that are deep, uneven, or bleeding. It is not used inside of a wound.  °Adhesive Strips  °These strips are made of sticky (adhesive), porous paper. They are applied across your  skin edges like a regular adhesive bandage. You leave them on until they fall off.  °Adhesive strips may be used to close very superficial wounds. They may also be used along with sutures to improve the closure of your skin edges.  °Sutures  °Sutures are the oldest method of wound closure. Sutures can be made from natural substances, such as silk, or from synthetic materials, such as nylon and steel. They can be made from a material that your body can break down as your wound heals (absorbable), or they can be made from a material that needs to be removed from your skin (nonabsorbable). They come in many different strengths and sizes.  °Your health care provider attaches the sutures to a steel needle on one end. Sutures can be passed through your skin, or through the tissues beneath your skin. Then they are tied and cut. Your skin edges may be closed in one continuous stitch or in separate stitches.  °Sutures are strong and can be used for all kinds of wounds. Absorbable sutures may be used to close tissues under the skin. The disadvantage of sutures is that they may cause skin reactions that lead to infection. Nonabsorbable sutures need to be removed.  °Staples  °When surgical staples are used to close a wound, the edges of your skin on both sides of the wound are brought close together. A staple is placed across the wound, and   an instrument secures the edges together. Staples are often used to close surgical cuts (incisions).  °Staples are faster to use than sutures, and they cause less skin reaction. Staples need to be removed using a tool that bends the staples away from your skin.  °HOW DO I CARE FOR MY WOUND CLOSURE?  °Take medicines only as directed by your health care provider.  °If you were prescribed an antibiotic medicine for your wound, finish it all even if you start to feel better.  °Use ointments or creams only as directed by your health care provider.  °Wash your hands with soap and water before and  after touching your wound.  °Do not soak your wound in water. Do not take baths, swim, or use a hot tub until your health care provider approves.  °Ask your health care provider when you can start showering. Cover your wound if directed by your health care provider.  °Do not take out your own sutures or staples.  °Do not pick at your wound. Picking can cause an infection.  °Keep all follow-up visits as directed by your health care provider. This is important. °HOW LONG WILL I HAVE MY WOUND CLOSURE?  °Leave adhesive glue on your skin until the glue peels away.  °Leave adhesive strips on your skin until the strips fall off.  °Absorbable sutures will dissolve within several days.  °Nonabsorbable sutures and staples must be removed. The location of the wound will determine how long they stay in. This can range from several days to a couple of weeks. °WHEN SHOULD I SEEK HELP FOR MY WOUND CLOSURE?  °Contact your health care provider if:  °You have a fever.  °You have chills.  °You have drainage, redness, swelling, or pain at your wound.  °There is a bad smell coming from your wound.  °The skin edges of your wound start to separate after your sutures have been removed.  °Your wound becomes thick, raised, and darker in color after your sutures come out (scarring). °This information is not intended to replace advice given to you by your health care provider. Make sure you discuss any questions you have with your health care provider.  °Document Released: 04/09/2001 Document Revised: 08/05/2014 Document Reviewed: 12/22/2013  °Elsevier Interactive Patient Education ©2016 [employer]  ° °
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
## 2015-08-28 ENCOUNTER — Encounter (HOSPITAL_COMMUNITY): Payer: Self-pay | Admitting: Emergency Medicine

## 2015-08-28 ENCOUNTER — Emergency Department (HOSPITAL_COMMUNITY)
Admission: EM | Admit: 2015-08-28 | Discharge: 2015-08-28 | Disposition: A | Discharge status: Home / Self Care | Payer: Medicare Other | Attending: Emergency Medicine | Admitting: Emergency Medicine

## 2015-08-28 DIAGNOSIS — Z4802 Encounter for removal of sutures: Secondary | ICD-10-CM | POA: Diagnosis not present

## 2015-08-28 DIAGNOSIS — Z87820 Personal history of traumatic brain injury: Secondary | ICD-10-CM | POA: Insufficient documentation

## 2015-08-28 NOTE — Discharge Instructions (Signed)
Staple Removal, Care After Refer to this sheet in the next few weeks. These instructions provide you with information on caring for yourself after your procedure. Your health care provider may also give you more specific instructions. Your treatment has been planned according to current medical practices, but problems sometimes occur. Call your health care provider if you have any problems or questions after your procedure. WHAT TO EXPECT AFTER THE PROCEDURE After your staples are removed, it is typical to have the following:  Some discomfort and swelling in the wound area.  Slight redness in the area. HOME CARE INSTRUCTIONS   If you have skin adhesive strips over the wound area, do not take the strips off. They will fall off on their own in a few days. If the strips remain in place after 14 days, you may remove them.  Change any bandages (dressings) at least once a day or as directed by your health care provider. If the bandage sticks, soak it off with warm, soapy water.  Apply cream or ointment only as directed by your health care provider. If using cream or ointment, wash the area with soap and water 2 times a day to remove all the cream or ointment. Rinse off the soap and pat the area dry with a clean towel.  Keep the wound area dry and clean. If the bandage becomes wet or dirty, or if it develops a bad smell, change it as soon as possible.  Continue to protect the wound from injury.  Use sunscreen when out in the sun. New scars become sunburned easily. SEEK MEDICAL CARE IF:  You have increasing redness, swelling, or pain in the wound.  You see pus coming from the wound.  You have a fever.  You notice a bad smell coming from the wound or dressing.  Your wound breaks open (edges not staying together).   This information is not intended to replace advice given to you by your health care provider. Make sure you discuss any questions you have with your health care provider.     Document Released: 04/09/2001 Document Revised: 05/05/2013 Document Reviewed: 02/24/2013 Elsevier Interactive Patient Education [employer].

## 2015-08-28 NOTE — ED Notes (Signed)
Pt is here for staple removal from head. Denies any issues. A&Ox4 and ambulatory.
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
## 2016-01-23 ENCOUNTER — Emergency Department (HOSPITAL_COMMUNITY)
Admission: EM | Admit: 2016-01-23 | Discharge: 2016-01-23 | Disposition: A | Discharge status: Home / Self Care | Payer: Medicare Other | Attending: Emergency Medicine | Admitting: Emergency Medicine

## 2016-01-23 ENCOUNTER — Encounter (HOSPITAL_COMMUNITY): Payer: Self-pay | Admitting: Emergency Medicine

## 2016-01-23 DIAGNOSIS — Y999 Unspecified external cause status: Secondary | ICD-10-CM | POA: Diagnosis not present

## 2016-01-23 DIAGNOSIS — W19XXXA Unspecified fall, initial encounter: Secondary | ICD-10-CM | POA: Diagnosis not present

## 2016-01-23 DIAGNOSIS — S0181XA Laceration without foreign body of other part of head, initial encounter: Secondary | ICD-10-CM

## 2016-01-23 DIAGNOSIS — Z791 Long term (current) use of non-steroidal anti-inflammatories (NSAID): Secondary | ICD-10-CM | POA: Insufficient documentation

## 2016-01-23 DIAGNOSIS — Y92009 Unspecified place in unspecified non-institutional (private) residence as the place of occurrence of the external cause: Secondary | ICD-10-CM | POA: Insufficient documentation

## 2016-01-23 DIAGNOSIS — Y939 Activity, unspecified: Secondary | ICD-10-CM | POA: Diagnosis not present

## 2016-01-23 MED ORDER — TETANUS-DIPHTH-ACELL PERTUSSIS 5-2.5-18.5 LF-MCG/0.5 IM SUSP
0.5000 mL | Freq: Once | INTRAMUSCULAR | Status: AC
  Administered 2016-01-23: 0.5 mL via INTRAMUSCULAR
  Filled 2016-01-23: qty 0.5
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
## 2016-01-23 NOTE — ED Notes (Signed)
Bed: WA20 Expected date:  Expected time:  Means of arrival:  Comments: EMS 45yo F Fall / left eye laceration

## 2016-01-23 NOTE — Discharge Instructions (Signed)
Facial Laceration ° A facial laceration is a cut on the face. These injuries can be painful and cause bleeding. Lacerations usually heal quickly, but they need special care to reduce scarring. °DIAGNOSIS  °Your health care provider will take a medical history, ask for details about how the injury occurred, and examine the wound to determine how deep the cut is. °TREATMENT  °Some facial lacerations may not require closure. Others may not be able to be closed because of an increased risk of infection. The risk of infection and the chance for successful closure will depend on various factors, including the amount of time since the injury occurred. °The wound may be cleaned to help prevent infection. If closure is appropriate, pain medicines may be given if needed. Your health care provider will use stitches (sutures), wound glue (adhesive), or skin adhesive strips to repair the laceration. These tools bring the skin edges together to allow for faster healing and a better cosmetic outcome. If needed, you may also be given a tetanus shot. °HOME CARE INSTRUCTIONS °· Only take over-the-counter or prescription medicines as directed by your health care provider. °· Follow your health care provider's instructions for wound care. These instructions will vary depending on the technique used for closing the wound. °For Sutures: °· Keep the wound clean and dry.   °· If you were given a bandage (dressing), you should change it at least once a day. Also change the dressing if it becomes wet or dirty, or as directed by your health care provider.   °· Wash the wound with soap and water 2 times a day. Rinse the wound off with water to remove all soap. Pat the wound dry with a clean towel.   °· After cleaning, apply a thin layer of the antibiotic ointment recommended by your health care provider. This will help prevent infection and keep the dressing from sticking.   °· You may shower as usual after the first 24 hours. Do not soak the  wound in water until the sutures are removed.   °· Get your sutures removed as directed by your health care provider. With facial lacerations, sutures should usually be taken out after 4-5 days to avoid stitch marks.   °· Wait a few days after your sutures are removed before applying any makeup. °For Skin Adhesive Strips: °· Keep the wound clean and dry.   °· Do not get the skin adhesive strips wet. You may bathe carefully, using caution to keep the wound dry.   °· If the wound gets wet, pat it dry with a clean towel.   °· Skin adhesive strips will fall off on their own. You may trim the strips as the wound heals. Do not remove skin adhesive strips that are still stuck to the wound. They will fall off in time.   °For Wound Adhesive: °· You may briefly wet your wound in the shower or bath. Do not soak or scrub the wound. Do not swim. Avoid periods of heavy sweating until the skin adhesive has fallen off on its own. After showering or bathing, gently pat the wound dry with a clean towel.   °· Do not apply liquid medicine, cream medicine, ointment medicine, or makeup to your wound while the skin adhesive is in place. This may loosen the film before your wound is healed.   °· If a dressing is placed over the wound, be careful not to apply tape directly over the skin adhesive. This may cause the adhesive to be pulled off before the wound is healed.   °· Avoid   prolonged exposure to sunlight or tanning lamps while the skin adhesive is in place. °· The skin adhesive will usually remain in place for 5-10 days, then naturally fall off the skin. Do not pick at the adhesive film.   °After Healing: °Once the wound has healed, cover the wound with sunscreen during the day for 1 full year. This can help minimize scarring. Exposure to ultraviolet light in the first year will darken the scar. It can take 1-2 years for the scar to lose its redness and to heal completely.  °SEEK MEDICAL CARE IF: °· You have a fever. °SEEK IMMEDIATE  MEDICAL CARE IF: °· You have redness, pain, or swelling around the wound.   °· You see a yellowish-white fluid (pus) coming from the wound.   °  °This information is not intended to replace advice given to you by your health care provider. Make sure you discuss any questions you have with your health care provider. °  °Document Released: 08/22/2004 Document Revised: 08/05/2014 Document Reviewed: 02/25/2013 °Elsevier Interactive Patient Education ©2016 [employer] ° °

## 2016-01-23 NOTE — ED Notes (Signed)
Patient here from home with complaints of fall today. Laceration to left eye. No pain.

## 2016-10-05 ENCOUNTER — Emergency Department (HOSPITAL_COMMUNITY)
Admission: EM | Admit: 2016-10-05 | Discharge: 2016-10-06 | Disposition: A | Discharge status: Home / Self Care | Payer: Medicare Other | Attending: Emergency Medicine | Admitting: Emergency Medicine

## 2016-10-05 ENCOUNTER — Encounter (HOSPITAL_COMMUNITY): Payer: Self-pay | Admitting: Emergency Medicine

## 2016-10-05 DIAGNOSIS — S00212A Abrasion of left eyelid and periocular area, initial encounter: Secondary | ICD-10-CM | POA: Diagnosis present

## 2016-10-05 DIAGNOSIS — Y999 Unspecified external cause status: Secondary | ICD-10-CM | POA: Diagnosis not present

## 2016-10-05 DIAGNOSIS — W01198A Fall on same level from slipping, tripping and stumbling with subsequent striking against other object, initial encounter: Secondary | ICD-10-CM | POA: Diagnosis not present

## 2016-10-05 DIAGNOSIS — Y929 Unspecified place or not applicable: Secondary | ICD-10-CM | POA: Diagnosis not present

## 2016-10-05 DIAGNOSIS — Y939 Activity, unspecified: Secondary | ICD-10-CM | POA: Insufficient documentation

## 2016-10-05 DIAGNOSIS — W19XXXA Unspecified fall, initial encounter: Secondary | ICD-10-CM

## 2016-10-05 MED ORDER — BACITRACIN ZINC 500 UNIT/GM EX OINT
TOPICAL_OINTMENT | Freq: Two times a day (BID) | CUTANEOUS | Status: DC
  Administered 2016-10-05: 1 via TOPICAL
  Filled 2016-10-05: qty 0.9

## 2016-10-05 NOTE — ED Notes (Signed)
Bed: [facility] Expected date:  Expected time:  Means of arrival:  Comments: 
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
## 2016-10-05 NOTE — ED Notes (Signed)
Patient has been discharged and waiting on PTAR.

## 2016-10-06 NOTE — ED Notes (Signed)
Patient left with ptar
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
## 2016-10-06 NOTE — ED Notes (Signed)
Patient denies pain and is resting comfortably.  

## 2016-10-13 ENCOUNTER — Emergency Department (HOSPITAL_COMMUNITY)
Admission: EM | Admit: 2016-10-13 | Discharge: 2016-10-13 | Disposition: A | Discharge status: Home / Self Care | Payer: Medicare Other | Attending: Emergency Medicine | Admitting: Emergency Medicine

## 2016-10-13 ENCOUNTER — Encounter (HOSPITAL_COMMUNITY): Payer: Self-pay | Admitting: Emergency Medicine

## 2016-10-13 DIAGNOSIS — Y999 Unspecified external cause status: Secondary | ICD-10-CM | POA: Diagnosis not present

## 2016-10-13 DIAGNOSIS — S01112A Laceration without foreign body of left eyelid and periocular area, initial encounter: Secondary | ICD-10-CM | POA: Diagnosis not present

## 2016-10-13 DIAGNOSIS — W050XXA Fall from non-moving wheelchair, initial encounter: Secondary | ICD-10-CM | POA: Insufficient documentation

## 2016-10-13 DIAGNOSIS — W19XXXA Unspecified fall, initial encounter: Secondary | ICD-10-CM

## 2016-10-13 DIAGNOSIS — Y9289 Other specified places as the place of occurrence of the external cause: Secondary | ICD-10-CM | POA: Insufficient documentation

## 2016-10-13 DIAGNOSIS — Y939 Activity, unspecified: Secondary | ICD-10-CM | POA: Diagnosis not present

## 2016-10-13 DIAGNOSIS — S01111A Laceration without foreign body of right eyelid and periocular area, initial encounter: Secondary | ICD-10-CM

## 2016-10-13 DIAGNOSIS — Z91040 Latex allergy status: Secondary | ICD-10-CM | POA: Diagnosis not present

## 2016-10-13 NOTE — ED Triage Notes (Signed)
Pt. Stated[place], . Pt. Hit the corner of left eye. 1/2 inch laceration.

## 2016-10-13 NOTE — ED Notes (Signed)
Pt wheeled back to room from waiting area. Assisted pt into bed and onto bp and continuous o2 monitor.

## 2016-10-13 NOTE — ED Notes (Signed)
Pt given d/c instructions and teaching and verbalized understanding. This nurse put pt d/c papers in purse at pt request. PTAR called by this nurse for pt transport. Pt aware of plan.
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
## 2016-10-13 NOTE — Discharge Instructions (Signed)
Please read attached information. If you experience any new or worsening signs or symptoms please return to the emergency room for evaluation. Please follow-up with your primary care provider or specialist as discussed. Your sutures are dissolvable and you will not need to have them removed.

## 2016-10-13 NOTE — ED Notes (Signed)
ED Provider at bedside. 

## 2016-10-13 NOTE — ED Notes (Signed)
Per EDP, pt permitted to have food and water.
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
## 2016-11-15 ENCOUNTER — Encounter (HOSPITAL_COMMUNITY): Payer: Self-pay

## 2016-11-15 ENCOUNTER — Emergency Department (HOSPITAL_COMMUNITY)
Admission: EM | Admit: 2016-11-15 | Discharge: 2016-11-15 | Disposition: A | Discharge status: Home / Self Care | Payer: Medicare Other | Attending: Emergency Medicine | Admitting: Emergency Medicine

## 2016-11-15 DIAGNOSIS — N898 Other specified noninflammatory disorders of vagina: Secondary | ICD-10-CM

## 2016-11-15 DIAGNOSIS — Z91040 Latex allergy status: Secondary | ICD-10-CM | POA: Insufficient documentation

## 2016-11-15 DIAGNOSIS — L292 Pruritus vulvae: Secondary | ICD-10-CM | POA: Insufficient documentation

## 2016-11-15 LAB — URINALYSIS, ROUTINE W REFLEX MICROSCOPIC
BILIRUBIN URINE: NEGATIVE
Glucose, UA: NEGATIVE mg/dL
Ketones, ur: 5 mg/dL — AB
LEUKOCYTES UA: NEGATIVE
Nitrite: NEGATIVE
PH: 5 (ref 5.0–8.0)
Protein, ur: NEGATIVE mg/dL
SPECIFIC GRAVITY, URINE: 1.015 (ref 1.005–1.030)

## 2016-11-15 LAB — WET PREP, GENITAL
SPERM: NONE SEEN
Trich, Wet Prep: NONE SEEN
YEAST WET PREP: NONE SEEN

## 2016-11-15 MED ORDER — FLUCONAZOLE 150 MG PO TABS
150.0000 mg | ORAL_TABLET | Freq: Once | ORAL | Status: AC
  Administered 2016-11-15: 150 mg via ORAL
  Filled 2016-11-15: qty 1

## 2016-11-15 MED ORDER — METRONIDAZOLE 500 MG PO TABS: 500.0000 mg | ORAL_TABLET | Freq: Two times a day (BID) | ORAL | 0 refills | Status: DC
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
## 2016-11-15 NOTE — ED Triage Notes (Signed)
Pt has had itching and rash to buttocks x 1 week.  Burning.  Pt is not incontinent.

## 2016-11-15 NOTE — ED Provider Notes (Signed)
MSE was initiated and I personally evaluated the patient and placed orders (if any) at  4:11 PM on November 15, 2016.  Candace Bailey is a 46 y.o. female with a PMHx of Traumatic Brain Injury, who presents to the Emergency Department complaining of multiple itchy areas and redness to the buttocks and vaginal area onset five days ago. She states it is somewhat painful, like a burning. She has not tried anything for relief of her symptoms PTA. Pt has associated symptoms of warmth to the area, hematuria, and vaginal itching. No recent Abx use. Pt denies drainage from the area, fevers, chills, CP, SOB, abd pain, N/V/D/C, dysuria, vaginal bleeding, vaginal discharge, myalgias, arthralgias, numbness, tingling, focal weakness beyond baseline left-sided weakness, or any other complaints at this time.   On exam, chaperone present, and exam is limited due to lack of appropriate examination table, no rash to buttocks. Unable to perform pelvic/vaginal exam due to lack of appropriate table. Given symptoms and inability to perform adequate exam in this fast track setting, will move pt to more appropriate area. Ordered pelvic cart and wet prep for when she gets back to a room; also ordered U/A.   The patient appears stable so that the remainder of the MSE may be completed by another provider.   [address], PA-C 11/15/16 [place], MD 11/16/16 1150

## 2016-11-15 NOTE — Discharge Instructions (Signed)
Take antibiotic twice a day for a week Follow up with your doctor

## 2016-11-18 LAB — GC/CHLAMYDIA PROBE AMP (~~LOC~~) NOT AT ARMC
CHLAMYDIA, DNA PROBE: NEGATIVE
NEISSERIA GONORRHEA: NEGATIVE

## 2017-04-16 DIAGNOSIS — G8110 Spastic hemiplegia affecting unspecified side: Secondary | ICD-10-CM | POA: Diagnosis not present

## 2017-04-16 DIAGNOSIS — R252 Cramp and spasm: Secondary | ICD-10-CM | POA: Diagnosis not present

## 2017-04-16 DIAGNOSIS — Z0001 Encounter for general adult medical examination with abnormal findings: Secondary | ICD-10-CM | POA: Diagnosis not present

## 2017-04-16 DIAGNOSIS — W19XXXA Unspecified fall, initial encounter: Secondary | ICD-10-CM | POA: Diagnosis not present

## 2017-04-16 DIAGNOSIS — E669 Obesity, unspecified: Secondary | ICD-10-CM | POA: Diagnosis not present

## 2017-04-16 DIAGNOSIS — E559 Vitamin D deficiency, unspecified: Secondary | ICD-10-CM | POA: Diagnosis not present

## 2017-04-16 DIAGNOSIS — G8250 Quadriplegia, unspecified: Secondary | ICD-10-CM | POA: Diagnosis not present

## 2017-04-16 DIAGNOSIS — Z1389 Encounter for screening for other disorder: Secondary | ICD-10-CM | POA: Diagnosis not present

## 2017-04-16 DIAGNOSIS — F410 Panic disorder [episodic paroxysmal anxiety] without agoraphobia: Secondary | ICD-10-CM | POA: Diagnosis not present

## 2017-04-16 DIAGNOSIS — L309 Dermatitis, unspecified: Secondary | ICD-10-CM | POA: Diagnosis not present

## 2017-04-16 DIAGNOSIS — Z23 Encounter for immunization: Secondary | ICD-10-CM | POA: Diagnosis not present

## 2017-04-30 DIAGNOSIS — E559 Vitamin D deficiency, unspecified: Secondary | ICD-10-CM | POA: Diagnosis not present

## 2017-04-30 DIAGNOSIS — E663 Overweight: Secondary | ICD-10-CM | POA: Diagnosis not present

## 2017-04-30 DIAGNOSIS — Z79899 Other long term (current) drug therapy: Secondary | ICD-10-CM | POA: Diagnosis not present

## 2017-04-30 DIAGNOSIS — G8110 Spastic hemiplegia affecting unspecified side: Secondary | ICD-10-CM | POA: Diagnosis not present

## 2017-04-30 DIAGNOSIS — F410 Panic disorder [episodic paroxysmal anxiety] without agoraphobia: Secondary | ICD-10-CM | POA: Diagnosis not present

## 2017-07-02 ENCOUNTER — Other Ambulatory Visit: Payer: Self-pay

## 2017-07-02 ENCOUNTER — Emergency Department (HOSPITAL_COMMUNITY)
Admission: EM | Admit: 2017-07-02 | Discharge: 2017-07-02 | Disposition: A | Discharge status: Home / Self Care | Payer: Medicare Other | Attending: Emergency Medicine | Admitting: Emergency Medicine

## 2017-07-02 ENCOUNTER — Encounter (HOSPITAL_COMMUNITY): Payer: Self-pay | Admitting: Emergency Medicine

## 2017-07-02 DIAGNOSIS — W19XXXA Unspecified fall, initial encounter: Secondary | ICD-10-CM

## 2017-07-02 DIAGNOSIS — Z043 Encounter for examination and observation following other accident: Secondary | ICD-10-CM | POA: Insufficient documentation

## 2017-07-02 DIAGNOSIS — Z79899 Other long term (current) drug therapy: Secondary | ICD-10-CM | POA: Insufficient documentation

## 2017-07-02 DIAGNOSIS — R69 Illness, unspecified: Secondary | ICD-10-CM | POA: Diagnosis not present

## 2017-07-02 HISTORY — DX: Unspecified speech disturbances: R47.9
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
## 2017-07-02 NOTE — Discharge Instructions (Signed)
If you develop any problems have someone bring you back to the ED.
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
## 2017-08-19 DIAGNOSIS — R69 Illness, unspecified: Secondary | ICD-10-CM | POA: Diagnosis not present

## 2017-10-21 DIAGNOSIS — Z1231 Encounter for screening mammogram for malignant neoplasm of breast: Secondary | ICD-10-CM | POA: Diagnosis not present

## 2017-10-21 DIAGNOSIS — N760 Acute vaginitis: Secondary | ICD-10-CM | POA: Diagnosis not present

## 2017-10-21 DIAGNOSIS — N390 Urinary tract infection, site not specified: Secondary | ICD-10-CM | POA: Diagnosis not present

## 2018-01-26 ENCOUNTER — Emergency Department (HOSPITAL_COMMUNITY)
Admission: EM | Admit: 2018-01-26 | Discharge: 2018-01-26 | Disposition: A | Discharge status: Home / Self Care | Payer: Medicare HMO | Attending: Emergency Medicine | Admitting: Emergency Medicine

## 2018-01-26 ENCOUNTER — Emergency Department (HOSPITAL_COMMUNITY): Payer: Medicare HMO

## 2018-01-26 ENCOUNTER — Encounter (HOSPITAL_COMMUNITY): Payer: Self-pay

## 2018-01-26 ENCOUNTER — Other Ambulatory Visit: Payer: Self-pay

## 2018-01-26 DIAGNOSIS — R05 Cough: Secondary | ICD-10-CM | POA: Insufficient documentation

## 2018-01-26 DIAGNOSIS — R059 Cough, unspecified: Secondary | ICD-10-CM

## 2018-01-26 DIAGNOSIS — R531 Weakness: Secondary | ICD-10-CM | POA: Diagnosis not present

## 2018-01-26 MED ORDER — PANTOPRAZOLE SODIUM 20 MG PO TBEC: 20.0000 mg | DELAYED_RELEASE_TABLET | Freq: Every day | ORAL | 0 refills | Status: DC

## 2018-01-26 NOTE — Discharge Instructions (Signed)
Take this new medication, Protonix, one time a day.   It is very important that you schedule an appointment with Dr. Inda Merlin, your primary care doctor, and schedule an appointment with him.
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
## 2018-01-26 NOTE — ED Triage Notes (Signed)
Patient c/o dry cough x 1 year. Patient states has not gotten any worse in a year.

## 2018-02-16 DIAGNOSIS — R69 Illness, unspecified: Secondary | ICD-10-CM | POA: Diagnosis not present

## 2018-04-15 ENCOUNTER — Emergency Department (HOSPITAL_COMMUNITY)
Admission: EM | Admit: 2018-04-15 | Discharge: 2018-04-16 | Disposition: A | Discharge status: Home / Self Care | Payer: Medicare HMO | Attending: Emergency Medicine | Admitting: Emergency Medicine

## 2018-04-15 ENCOUNTER — Encounter (HOSPITAL_COMMUNITY): Payer: Self-pay

## 2018-04-15 DIAGNOSIS — Z87820 Personal history of traumatic brain injury: Secondary | ICD-10-CM | POA: Diagnosis not present

## 2018-04-15 DIAGNOSIS — Y92018 Other place in single-family (private) house as the place of occurrence of the external cause: Secondary | ICD-10-CM | POA: Insufficient documentation

## 2018-04-15 DIAGNOSIS — Y93E8 Activity, other personal hygiene: Secondary | ICD-10-CM | POA: Diagnosis not present

## 2018-04-15 DIAGNOSIS — W01198A Fall on same level from slipping, tripping and stumbling with subsequent striking against other object, initial encounter: Secondary | ICD-10-CM | POA: Diagnosis not present

## 2018-04-15 DIAGNOSIS — W19XXXA Unspecified fall, initial encounter: Secondary | ICD-10-CM

## 2018-04-15 DIAGNOSIS — Y999 Unspecified external cause status: Secondary | ICD-10-CM | POA: Diagnosis not present

## 2018-04-15 DIAGNOSIS — S0101XA Laceration without foreign body of scalp, initial encounter: Secondary | ICD-10-CM | POA: Diagnosis not present

## 2018-04-15 DIAGNOSIS — R58 Hemorrhage, not elsewhere classified: Secondary | ICD-10-CM | POA: Diagnosis not present

## 2018-04-15 DIAGNOSIS — S0990XA Unspecified injury of head, initial encounter: Secondary | ICD-10-CM | POA: Diagnosis not present
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
## 2018-04-16 DIAGNOSIS — Z7401 Bed confinement status: Secondary | ICD-10-CM | POA: Diagnosis not present

## 2018-04-16 DIAGNOSIS — W01198A Fall on same level from slipping, tripping and stumbling with subsequent striking against other object, initial encounter: Secondary | ICD-10-CM | POA: Diagnosis not present

## 2018-04-16 DIAGNOSIS — M2550 Pain in unspecified joint: Secondary | ICD-10-CM | POA: Diagnosis not present

## 2018-04-16 DIAGNOSIS — Z87820 Personal history of traumatic brain injury: Secondary | ICD-10-CM | POA: Diagnosis not present

## 2018-04-16 DIAGNOSIS — S0101XA Laceration without foreign body of scalp, initial encounter: Secondary | ICD-10-CM | POA: Diagnosis not present

## 2018-04-16 MED ORDER — LIDOCAINE-EPINEPHRINE 2 %-1:200000 IJ SOLN
10.0000 mL | Freq: Once | INTRAMUSCULAR | Status: AC
Start: 2018-04-16 — End: 2018-04-16
  Administered 2018-04-16: 10 mL
  Filled 2018-04-16: qty 20

## 2018-04-16 NOTE — ED Notes (Signed)
Pt notified that [facility] has been called for transportation back to her house

## 2018-04-16 NOTE — ED Notes (Signed)
ED Provider at bedside. 

## 2018-04-16 NOTE — ED Notes (Signed)
Patient verbalizes understanding of discharge instructions. Opportunity for questioning and answers were provided. Armband removed by staff, pt discharged from ED. Pt discharged home with [facility].
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
## 2018-04-16 NOTE — Discharge Instructions (Signed)
Wound Care You may remove the bandage after 24 hours. Clean the wound and surrounding area gently with tap water and mild soap. Rinse well and blot dry. Do not scrub the wound, as this may cause the wound edges to come apart. You may shower, but avoid submerging the wound, such as with a bath or swimming. Clean the wound daily to prevent infection. Do not use cleaners such as hydrogen peroxide or alcohol.   Scar reduction: Application of a topical antibiotic ointment, such as Neosporin, after the wound has begun to close and heal well can decrease scab formation and reduce scarring. After the wound has healed and wound closures have been removed, application of ointments such as Aquaphor can also reduce scar formation.  The key to scar reduction is keeping the skin well hydrated and supple. Drinking plenty of water throughout the day (At least eight 8oz glasses of water a day) is essential to staying well hydrated.  Sun exposure: Keep the wound out of the sun. After the wound has healed, continue to protect it from the sun by wearing protective clothing or applying sunscreen.  Pain: You may use Tylenol, naproxen, or ibuprofen for pain.  Suture/staple removal: Return to the ED in 5-7 days for staple removal.  Return to the ED sooner should the wound edges come apart or signs of infection arise, such as spreading redness, puffiness/swelling, pus draining from the wound, severe increase in pain, fever over 100.9F, or any other major issues.   Head Injury You have been seen today for a head injury. It does not appear to be serious at this time.  Close observation: The close observation period is usually 6 hours from the injury. This includes staying awake and having a trustworthy adult monitor you to assure your condition does not worsen. You should be in regular contact with this person and ideally, they should be able to monitor you in person.  Secondary observation: The secondary observation  period is usually 24 hours from the injury. You are allowed to sleep during this time. A trustworthy adult should intermittently monitor you to assure your condition does not worsen.   Overall head injury/concussion care: Rest: Be sure to get plenty of rest. You will need more rest and sleep while you recover. Hydration: Be sure to stay well hydrated by having a goal of drinking about 0.5 liters of water an hour. Pain:  Antiinflammatory medications: Take 600 mg of ibuprofen every 6 hours or 440 mg (over the counter dose) to 500 mg (prescription dose) of naproxen every 12 hours or for the next 3 days. After this time, these medications may be used as needed for pain. Take these medications with food to avoid upset stomach. Choose only one of these medications, do not take them together. Tylenol: Should you continue to have additional pain while taking the ibuprofen or naproxen, you may add in tylenol as needed. Your daily total maximum amount of tylenol from all sources should be limited to 4000mg /day for persons without liver problems, or 2000mg /day for those with liver problems. Return to sports and activities: In general, you may return to normal activities once symptoms have subsided, however, you would ideally be cleared by a primary care provider or other qualified medical professional prior to return to these activities.  Follow up: Follow up with the concussion clinic or your primary care provider for further management of this issue. Return: Return to the ED should you begin to have confusion, abnormal behavior, aggression, violence,  or personality changes, repeated vomiting, vision loss, numbness or weakness on one side of the body, difficulty standing due to dizziness, significantly worsening pain, or any other major concerns.

## 2018-04-21 DIAGNOSIS — S0191XA Laceration without foreign body of unspecified part of head, initial encounter: Secondary | ICD-10-CM | POA: Diagnosis not present

## 2018-04-21 DIAGNOSIS — Z1389 Encounter for screening for other disorder: Secondary | ICD-10-CM | POA: Diagnosis not present

## 2018-04-21 DIAGNOSIS — W19XXXA Unspecified fall, initial encounter: Secondary | ICD-10-CM | POA: Diagnosis not present

## 2018-04-21 DIAGNOSIS — Z79899 Other long term (current) drug therapy: Secondary | ICD-10-CM | POA: Diagnosis not present

## 2018-04-21 DIAGNOSIS — E559 Vitamin D deficiency, unspecified: Secondary | ICD-10-CM | POA: Diagnosis not present

## 2018-04-21 DIAGNOSIS — L309 Dermatitis, unspecified: Secondary | ICD-10-CM | POA: Diagnosis not present

## 2018-04-21 DIAGNOSIS — Z13220 Encounter for screening for lipoid disorders: Secondary | ICD-10-CM | POA: Diagnosis not present

## 2018-04-21 DIAGNOSIS — G8250 Quadriplegia, unspecified: Secondary | ICD-10-CM | POA: Diagnosis not present

## 2018-04-21 DIAGNOSIS — G8110 Spastic hemiplegia affecting unspecified side: Secondary | ICD-10-CM | POA: Diagnosis not present

## 2018-04-21 DIAGNOSIS — Z0001 Encounter for general adult medical examination with abnormal findings: Secondary | ICD-10-CM | POA: Diagnosis not present

## 2018-05-08 DIAGNOSIS — L280 Lichen simplex chronicus: Secondary | ICD-10-CM | POA: Diagnosis not present

## 2018-05-22 DIAGNOSIS — H52223 Regular astigmatism, bilateral: Secondary | ICD-10-CM | POA: Diagnosis not present

## 2018-05-22 DIAGNOSIS — H5213 Myopia, bilateral: Secondary | ICD-10-CM | POA: Diagnosis not present

## 2018-05-22 DIAGNOSIS — H524 Presbyopia: Secondary | ICD-10-CM | POA: Diagnosis not present

## 2018-05-26 DIAGNOSIS — Z0100 Encounter for examination of eyes and vision without abnormal findings: Secondary | ICD-10-CM | POA: Diagnosis not present

## 2018-08-22 ENCOUNTER — Emergency Department (HOSPITAL_COMMUNITY)
Admission: EM | Admit: 2018-08-22 | Discharge: 2018-08-22 | Disposition: A | Discharge status: Home / Self Care | Payer: Medicare HMO | Attending: Emergency Medicine | Admitting: Emergency Medicine

## 2018-08-22 ENCOUNTER — Encounter (HOSPITAL_COMMUNITY): Payer: Self-pay | Admitting: Nurse Practitioner

## 2018-08-22 DIAGNOSIS — Y999 Unspecified external cause status: Secondary | ICD-10-CM | POA: Diagnosis not present

## 2018-08-22 DIAGNOSIS — Y9389 Activity, other specified: Secondary | ICD-10-CM | POA: Diagnosis not present

## 2018-08-22 DIAGNOSIS — Z91040 Latex allergy status: Secondary | ICD-10-CM | POA: Insufficient documentation

## 2018-08-22 DIAGNOSIS — W19XXXA Unspecified fall, initial encounter: Secondary | ICD-10-CM | POA: Diagnosis not present

## 2018-08-22 DIAGNOSIS — Y92002 Bathroom of unspecified non-institutional (private) residence single-family (private) house as the place of occurrence of the external cause: Secondary | ICD-10-CM | POA: Insufficient documentation

## 2018-08-22 DIAGNOSIS — S01312A Laceration without foreign body of left ear, initial encounter: Secondary | ICD-10-CM | POA: Insufficient documentation

## 2018-08-22 DIAGNOSIS — R5381 Other malaise: Secondary | ICD-10-CM | POA: Diagnosis not present

## 2018-08-22 DIAGNOSIS — S09302A Unspecified injury of left middle and inner ear, initial encounter: Secondary | ICD-10-CM | POA: Diagnosis present

## 2018-08-22 DIAGNOSIS — S01319A Laceration without foreign body of unspecified ear, initial encounter: Secondary | ICD-10-CM

## 2018-08-22 DIAGNOSIS — S0990XA Unspecified injury of head, initial encounter: Secondary | ICD-10-CM | POA: Diagnosis not present

## 2018-08-22 DIAGNOSIS — W0110XA Fall on same level from slipping, tripping and stumbling with subsequent striking against unspecified object, initial encounter: Secondary | ICD-10-CM | POA: Insufficient documentation

## 2018-08-22 MED ORDER — LIDOCAINE HCL 2 % IJ SOLN
10.0000 mL | Freq: Once | INTRAMUSCULAR | Status: AC
  Administered 2018-08-22: 200 mg
  Filled 2018-08-22: qty 20

## 2018-08-22 NOTE — ED Triage Notes (Signed)
Pt is presented by EMS for evaluation post mechanical fall, left ear laceration without LOC.

## 2018-08-22 NOTE — Discharge Instructions (Addendum)
Have the sutures taken out in 7 to 10 days at the ear nose and throat doctor.
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
## 2018-08-22 NOTE — ED Notes (Signed)
Bed: [facility]09 Expected date:  Expected time:  Means of arrival:  Comments: 48 yo fall, ear lac

## 2018-08-31 DIAGNOSIS — N898 Other specified noninflammatory disorders of vagina: Secondary | ICD-10-CM | POA: Diagnosis not present

## 2018-09-10 ENCOUNTER — Encounter (HOSPITAL_COMMUNITY): Payer: Self-pay

## 2018-09-10 ENCOUNTER — Other Ambulatory Visit: Payer: Self-pay

## 2018-09-10 ENCOUNTER — Emergency Department (HOSPITAL_COMMUNITY)
Admission: EM | Admit: 2018-09-10 | Discharge: 2018-09-10 | Disposition: A | Discharge status: Home / Self Care | Payer: Medicare HMO | Attending: Emergency Medicine | Admitting: Emergency Medicine

## 2018-09-10 ENCOUNTER — Emergency Department (HOSPITAL_COMMUNITY): Payer: Medicare HMO

## 2018-09-10 DIAGNOSIS — Y92012 Bathroom of single-family (private) house as the place of occurrence of the external cause: Secondary | ICD-10-CM | POA: Diagnosis not present

## 2018-09-10 DIAGNOSIS — Y9301 Activity, walking, marching and hiking: Secondary | ICD-10-CM | POA: Diagnosis not present

## 2018-09-10 DIAGNOSIS — W19XXXA Unspecified fall, initial encounter: Secondary | ICD-10-CM | POA: Diagnosis not present

## 2018-09-10 DIAGNOSIS — Y999 Unspecified external cause status: Secondary | ICD-10-CM | POA: Insufficient documentation

## 2018-09-10 DIAGNOSIS — R5381 Other malaise: Secondary | ICD-10-CM | POA: Diagnosis not present

## 2018-09-10 DIAGNOSIS — S299XXA Unspecified injury of thorax, initial encounter: Secondary | ICD-10-CM | POA: Diagnosis present

## 2018-09-10 DIAGNOSIS — R52 Pain, unspecified: Secondary | ICD-10-CM | POA: Diagnosis not present

## 2018-09-10 DIAGNOSIS — W1789XA Other fall from one level to another, initial encounter: Secondary | ICD-10-CM | POA: Insufficient documentation

## 2018-09-10 DIAGNOSIS — S2242XA Multiple fractures of ribs, left side, initial encounter for closed fracture: Secondary | ICD-10-CM | POA: Diagnosis not present

## 2018-09-10 DIAGNOSIS — M2550 Pain in unspecified joint: Secondary | ICD-10-CM | POA: Diagnosis not present

## 2018-09-10 DIAGNOSIS — Z7401 Bed confinement status: Secondary | ICD-10-CM | POA: Diagnosis not present

## 2018-09-10 LAB — URINALYSIS, ROUTINE W REFLEX MICROSCOPIC
Bilirubin Urine: NEGATIVE
GLUCOSE, UA: NEGATIVE mg/dL
Hgb urine dipstick: NEGATIVE
KETONES UR: NEGATIVE mg/dL
LEUKOCYTE UA: NEGATIVE
Nitrite: NEGATIVE
PH: 6 (ref 5.0–8.0)
Protein, ur: NEGATIVE mg/dL
Specific Gravity, Urine: 1.011 (ref 1.005–1.030)

## 2018-09-10 LAB — PREGNANCY, URINE: Preg Test, Ur: NEGATIVE

## 2018-09-10 MED ORDER — IBUPROFEN 200 MG PO TABS
600.0000 mg | ORAL_TABLET | Freq: Once | ORAL | Status: AC
  Administered 2018-09-10: 600 mg via ORAL
  Filled 2018-09-10: qty 3

## 2018-09-10 MED ORDER — ACETAMINOPHEN 325 MG PO TABS
650.0000 mg | ORAL_TABLET | Freq: Once | ORAL | Status: AC
  Administered 2018-09-10: 650 mg via ORAL
  Filled 2018-09-10: qty 2

## 2018-09-10 NOTE — ED Notes (Signed)
[facility] called for patient transportation home due to patient being unable to ambulate without her assistive device (specialized walker), which she left at home.

## 2018-09-10 NOTE — ED Notes (Signed)
Bed: [facility] Expected date:  Expected time:  Means of arrival:  Comments: EMS 47yo fall

## 2018-09-10 NOTE — ED Notes (Signed)
Patient to be discharged with incentive spirometer, per MD order. Patient educated on how to use incentive spirometer and uses teach back to verify understanding.
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
## 2018-09-10 NOTE — Discharge Instructions (Addendum)
Your x-ray today shows 2 broken ribs.  Use the incentive spirometer as shown in the emergency department every hour while you are awake.  Alternate between ibuprofen and Tylenol for pain.  Follow-up with your primary care physician.  If you develop recurrent, continued, or worsening chest pain, shortness of breath, fever, coughing or coughing up blood, vomiting, abdominal or back pain, or any other new/concerning symptoms then return to the ER for evaluation.

## 2018-09-15 DIAGNOSIS — S2239XA Fracture of one rib, unspecified side, initial encounter for closed fracture: Secondary | ICD-10-CM | POA: Diagnosis not present

## 2018-10-06 DIAGNOSIS — R69 Illness, unspecified: Secondary | ICD-10-CM | POA: Diagnosis not present

## 2018-12-30 DIAGNOSIS — Z1231 Encounter for screening mammogram for malignant neoplasm of breast: Secondary | ICD-10-CM | POA: Diagnosis not present

## 2018-12-30 DIAGNOSIS — N912 Amenorrhea, unspecified: Secondary | ICD-10-CM | POA: Diagnosis not present

## 2019-02-03 DIAGNOSIS — H9201 Otalgia, right ear: Secondary | ICD-10-CM | POA: Diagnosis not present

## 2019-02-03 DIAGNOSIS — H6121 Impacted cerumen, right ear: Secondary | ICD-10-CM | POA: Diagnosis not present

## 2019-02-17 DIAGNOSIS — R69 Illness, unspecified: Secondary | ICD-10-CM | POA: Diagnosis not present

## 2019-04-27 DIAGNOSIS — Z13220 Encounter for screening for lipoid disorders: Secondary | ICD-10-CM | POA: Diagnosis not present

## 2019-04-27 DIAGNOSIS — L309 Dermatitis, unspecified: Secondary | ICD-10-CM | POA: Diagnosis not present

## 2019-04-27 DIAGNOSIS — G8110 Spastic hemiplegia affecting unspecified side: Secondary | ICD-10-CM | POA: Diagnosis not present

## 2019-04-27 DIAGNOSIS — G8250 Quadriplegia, unspecified: Secondary | ICD-10-CM | POA: Diagnosis not present

## 2019-04-27 DIAGNOSIS — Z1389 Encounter for screening for other disorder: Secondary | ICD-10-CM | POA: Diagnosis not present

## 2019-04-27 DIAGNOSIS — E669 Obesity, unspecified: Secondary | ICD-10-CM | POA: Diagnosis not present

## 2019-04-27 DIAGNOSIS — Z23 Encounter for immunization: Secondary | ICD-10-CM | POA: Diagnosis not present

## 2019-04-27 DIAGNOSIS — E559 Vitamin D deficiency, unspecified: Secondary | ICD-10-CM | POA: Diagnosis not present

## 2019-04-27 DIAGNOSIS — Z0001 Encounter for general adult medical examination with abnormal findings: Secondary | ICD-10-CM | POA: Diagnosis not present

## 2019-04-27 DIAGNOSIS — W19XXXA Unspecified fall, initial encounter: Secondary | ICD-10-CM | POA: Diagnosis not present

## 2019-04-29 DIAGNOSIS — H5213 Myopia, bilateral: Secondary | ICD-10-CM | POA: Diagnosis not present

## 2019-04-29 DIAGNOSIS — H52223 Regular astigmatism, bilateral: Secondary | ICD-10-CM | POA: Diagnosis not present

## 2019-04-29 DIAGNOSIS — H524 Presbyopia: Secondary | ICD-10-CM | POA: Diagnosis not present

## 2019-06-15 ENCOUNTER — Other Ambulatory Visit: Payer: Self-pay | Admitting: Obstetrics and Gynecology

## 2019-06-15 DIAGNOSIS — N939 Abnormal uterine and vaginal bleeding, unspecified: Secondary | ICD-10-CM

## 2019-06-17 ENCOUNTER — Ambulatory Visit (HOSPITAL_BASED_OUTPATIENT_CLINIC_OR_DEPARTMENT_OTHER): Admit: 2019-06-17 | Payer: Medicare HMO | Admitting: Obstetrics and Gynecology

## 2019-06-17 ENCOUNTER — Encounter (HOSPITAL_BASED_OUTPATIENT_CLINIC_OR_DEPARTMENT_OTHER): Payer: Self-pay

## 2019-06-17 SURGERY — DILATATION AND CURETTAGE /HYSTEROSCOPY
Anesthesia: Choice

## 2019-06-23 ENCOUNTER — Ambulatory Visit
Admission: RE | Admit: 2019-06-23 | Discharge: 2019-06-23 | Disposition: A | Discharge status: Home / Self Care | Payer: Medicare HMO | Source: Ambulatory Visit | Attending: Obstetrics and Gynecology | Admitting: Obstetrics and Gynecology

## 2019-06-23 DIAGNOSIS — N939 Abnormal uterine and vaginal bleeding, unspecified: Secondary | ICD-10-CM | POA: Diagnosis not present

## 2019-06-23 DIAGNOSIS — N83292 Other ovarian cyst, left side: Secondary | ICD-10-CM | POA: Diagnosis not present

## 2019-08-20 DIAGNOSIS — R5381 Other malaise: Secondary | ICD-10-CM | POA: Diagnosis not present

## 2019-08-20 DIAGNOSIS — W19XXXA Unspecified fall, initial encounter: Secondary | ICD-10-CM | POA: Diagnosis not present

## 2019-08-21 ENCOUNTER — Encounter (HOSPITAL_COMMUNITY): Payer: Self-pay | Admitting: Emergency Medicine

## 2019-08-21 ENCOUNTER — Emergency Department (HOSPITAL_COMMUNITY): Payer: Medicare HMO

## 2019-08-21 ENCOUNTER — Other Ambulatory Visit: Payer: Self-pay

## 2019-08-21 ENCOUNTER — Emergency Department (HOSPITAL_COMMUNITY)
Admission: EM | Admit: 2019-08-21 | Discharge: 2019-08-21 | Disposition: A | Discharge status: Home / Self Care | Payer: Medicare HMO | Attending: Emergency Medicine | Admitting: Emergency Medicine

## 2019-08-21 DIAGNOSIS — W19XXXA Unspecified fall, initial encounter: Secondary | ICD-10-CM

## 2019-08-21 DIAGNOSIS — Y999 Unspecified external cause status: Secondary | ICD-10-CM | POA: Insufficient documentation

## 2019-08-21 DIAGNOSIS — Z87820 Personal history of traumatic brain injury: Secondary | ICD-10-CM | POA: Insufficient documentation

## 2019-08-21 DIAGNOSIS — Y9389 Activity, other specified: Secondary | ICD-10-CM | POA: Diagnosis not present

## 2019-08-21 DIAGNOSIS — R279 Unspecified lack of coordination: Secondary | ICD-10-CM | POA: Diagnosis not present

## 2019-08-21 DIAGNOSIS — Z743 Need for continuous supervision: Secondary | ICD-10-CM | POA: Diagnosis not present

## 2019-08-21 DIAGNOSIS — W01198A Fall on same level from slipping, tripping and stumbling with subsequent striking against other object, initial encounter: Secondary | ICD-10-CM | POA: Insufficient documentation

## 2019-08-21 DIAGNOSIS — S0101XA Laceration without foreign body of scalp, initial encounter: Secondary | ICD-10-CM | POA: Diagnosis not present

## 2019-08-21 DIAGNOSIS — Y929 Unspecified place or not applicable: Secondary | ICD-10-CM | POA: Insufficient documentation

## 2019-08-21 DIAGNOSIS — R4789 Other speech disturbances: Secondary | ICD-10-CM | POA: Insufficient documentation

## 2019-08-21 DIAGNOSIS — R5381 Other malaise: Secondary | ICD-10-CM | POA: Diagnosis not present

## 2019-08-21 LAB — CBC WITH DIFFERENTIAL/PLATELET
Abs Immature Granulocytes: 0.03 10*3/uL (ref 0.00–0.07)
Basophils Absolute: 0.1 10*3/uL (ref 0.0–0.1)
Basophils Relative: 1 %
Eosinophils Absolute: 0.1 10*3/uL (ref 0.0–0.5)
Eosinophils Relative: 2 %
HCT: 36.9 % (ref 36.0–46.0)
Hemoglobin: 12.5 g/dL (ref 12.0–15.0)
Immature Granulocytes: 1 %
Lymphocytes Relative: 34 %
Lymphs Abs: 2 10*3/uL (ref 0.7–4.0)
MCH: 30 pg (ref 26.0–34.0)
MCHC: 33.9 g/dL (ref 30.0–36.0)
MCV: 88.5 fL (ref 80.0–100.0)
Monocytes Absolute: 0.5 10*3/uL (ref 0.1–1.0)
Monocytes Relative: 8 %
Neutro Abs: 3.1 10*3/uL (ref 1.7–7.7)
Neutrophils Relative %: 54 %
Platelets: 244 10*3/uL (ref 150–400)
RBC: 4.17 MIL/uL (ref 3.87–5.11)
RDW: 12.7 % (ref 11.5–15.5)
WBC: 5.8 10*3/uL (ref 4.0–10.5)
nRBC: 0 % (ref 0.0–0.2)

## 2019-08-21 LAB — BASIC METABOLIC PANEL
Anion gap: 10 (ref 5–15)
BUN: 23 mg/dL — ABNORMAL HIGH (ref 6–20)
CO2: 24 mmol/L (ref 22–32)
Calcium: 9 mg/dL (ref 8.9–10.3)
Chloride: 102 mmol/L (ref 98–111)
Creatinine, Ser: 0.85 mg/dL (ref 0.44–1.00)
GFR calc Af Amer: >60 mL/min (ref >60)
GFR calc non Af Amer: >60 mL/min (ref >60)
Glucose, Bld: 107 mg/dL — ABNORMAL HIGH (ref 70–99)
Potassium: 3.7 mmol/L (ref 3.5–5.1)
Sodium: 136 mmol/L (ref 135–145)

## 2019-08-21 MED ORDER — LIDOCAINE-EPINEPHRINE (PF) 2 %-1:200000 IJ SOLN
10.0000 mL | Freq: Once | INTRAMUSCULAR | Status: AC
  Administered 2019-08-21: 10 mL
  Filled 2019-08-21: qty 20

## 2019-08-21 NOTE — ED Notes (Signed)
PTAR called for pt 

## 2019-08-21 NOTE — Discharge Instructions (Addendum)
Your staples will need to be removed in 10 days by your doctor. Go for recheck sooner if you develop any sign of infection - increasing pain or redness, drainage from the wound, fever. Return to the emergency department as needed.

## 2019-08-21 NOTE — ED Notes (Signed)
Patient verbalizes understanding of discharge instructions. Opportunity for questioning and answers were provided. Armband removed by staff, pt discharged from ED. Pt. ambulatory and discharged home.  

## 2019-08-21 NOTE — ED Notes (Signed)
[facility] at bedside to transport patient 
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
## 2019-08-21 NOTE — ED Triage Notes (Addendum)
Patient arrived with EMS from home tripped and fell while using her walker this evening , hit her head against the door frame sustained scalp laceration approx 2 inches at occiput with minimal bleeding , denies pain/no LOC . No anticoagulant medication.

## 2019-09-07 DIAGNOSIS — S0101XD Laceration without foreign body of scalp, subsequent encounter: Secondary | ICD-10-CM | POA: Diagnosis not present

## 2019-10-26 DIAGNOSIS — R69 Illness, unspecified: Secondary | ICD-10-CM | POA: Diagnosis not present

## 2020-01-04 DIAGNOSIS — H5213 Myopia, bilateral: Secondary | ICD-10-CM | POA: Diagnosis not present

## 2020-01-06 DIAGNOSIS — Z4889 Encounter for other specified surgical aftercare: Secondary | ICD-10-CM | POA: Diagnosis not present

## 2020-01-06 DIAGNOSIS — N939 Abnormal uterine and vaginal bleeding, unspecified: Secondary | ICD-10-CM | POA: Diagnosis not present

## 2020-01-06 DIAGNOSIS — E559 Vitamin D deficiency, unspecified: Secondary | ICD-10-CM | POA: Diagnosis not present

## 2020-01-06 DIAGNOSIS — L309 Dermatitis, unspecified: Secondary | ICD-10-CM | POA: Diagnosis not present

## 2020-01-19 DIAGNOSIS — N950 Postmenopausal bleeding: Secondary | ICD-10-CM | POA: Diagnosis not present

## 2020-01-19 DIAGNOSIS — Z01419 Encounter for gynecological examination (general) (routine) without abnormal findings: Secondary | ICD-10-CM | POA: Diagnosis not present

## 2020-01-19 DIAGNOSIS — Z1231 Encounter for screening mammogram for malignant neoplasm of breast: Secondary | ICD-10-CM | POA: Diagnosis not present

## 2020-02-03 ENCOUNTER — Encounter (HOSPITAL_BASED_OUTPATIENT_CLINIC_OR_DEPARTMENT_OTHER): Payer: Self-pay | Admitting: Obstetrics and Gynecology

## 2020-02-03 ENCOUNTER — Other Ambulatory Visit: Payer: Self-pay
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
## 2020-02-04 NOTE — Progress Notes (Signed)
Left voice mail message with Candace Bailey at dr Ouida Sills office stating same conversation as 02-03-2020 progress note

## 2020-02-07 NOTE — Progress Notes (Signed)
Left voicemail message for patient to call [facility]acl and get pre op instructions and telthis rn who driver and caregiver is for day of surgery

## 2020-02-08 ENCOUNTER — Encounter (HOSPITAL_BASED_OUTPATIENT_CLINIC_OR_DEPARTMENT_OTHER): Payer: Self-pay | Admitting: Obstetrics and Gynecology

## 2020-02-08 NOTE — Progress Notes (Addendum)
Spoke w/ via phone for pre-op interview---pt Lab needs dos----   none            Lab results------none COVID test ------not needed Pt aware to bring vaccine card dos for verification. Arrive at -------1030 am 02-09-20 NPO after MN NO Solid Food.  Clear liquids from MN until---930 am Medications to take morning of surgery -----none Diabetic medication -----n/a Patient Special Instructions -----none Pre-Op special Istructions -----none Patient verbalized understanding of instructions that were given at this phone interview. Patient denies shortness of breath, chest pain, fever, cough a this phone interview.  Patient aware driver/caregiver needed and will arrange

## 2020-02-08 NOTE — Progress Notes (Signed)
Left message with surgery scheduler pt has not returned call to finish pre op, please call pt and confirm she is ahving surgery tomorrow

## 2020-02-09 ENCOUNTER — Other Ambulatory Visit (HOSPITAL_COMMUNITY): Payer: Self-pay | Admitting: Obstetrics and Gynecology

## 2020-02-09 ENCOUNTER — Ambulatory Visit (HOSPITAL_COMMUNITY)
Admission: RE | Admit: 2020-02-09 | Discharge: 2020-02-09 | Disposition: A | Discharge status: Home / Self Care | Payer: Medicare HMO | Source: Ambulatory Visit | Attending: Obstetrics and Gynecology | Admitting: Obstetrics and Gynecology

## 2020-02-09 ENCOUNTER — Ambulatory Visit (HOSPITAL_BASED_OUTPATIENT_CLINIC_OR_DEPARTMENT_OTHER)
Admission: RE | Admit: 2020-02-09 | Discharge: 2020-02-09 | Disposition: A | Discharge status: Home / Self Care | Payer: Medicare HMO | Attending: Obstetrics and Gynecology | Admitting: Obstetrics and Gynecology

## 2020-02-09 ENCOUNTER — Other Ambulatory Visit: Payer: Self-pay

## 2020-02-09 ENCOUNTER — Encounter (HOSPITAL_BASED_OUTPATIENT_CLINIC_OR_DEPARTMENT_OTHER): Payer: Self-pay | Admitting: Obstetrics and Gynecology

## 2020-02-09 ENCOUNTER — Ambulatory Visit (HOSPITAL_BASED_OUTPATIENT_CLINIC_OR_DEPARTMENT_OTHER): Payer: Medicare HMO | Admitting: Anesthesiology

## 2020-02-09 ENCOUNTER — Encounter (HOSPITAL_BASED_OUTPATIENT_CLINIC_OR_DEPARTMENT_OTHER)
Admission: RE | Disposition: A | Discharge status: Home / Self Care | Payer: Self-pay | Source: Home / Self Care | Attending: Obstetrics and Gynecology

## 2020-02-09 ENCOUNTER — Other Ambulatory Visit: Payer: Self-pay | Admitting: Obstetrics and Gynecology

## 2020-02-09 DIAGNOSIS — N840 Polyp of corpus uteri: Secondary | ICD-10-CM | POA: Diagnosis not present

## 2020-02-09 DIAGNOSIS — N950 Postmenopausal bleeding: Secondary | ICD-10-CM

## 2020-02-09 DIAGNOSIS — E860 Dehydration: Secondary | ICD-10-CM | POA: Diagnosis not present

## 2020-02-09 DIAGNOSIS — Z87820 Personal history of traumatic brain injury: Secondary | ICD-10-CM | POA: Diagnosis not present

## 2020-02-09 DIAGNOSIS — A0472 Enterocolitis due to Clostridium difficile, not specified as recurrent: Secondary | ICD-10-CM | POA: Diagnosis not present

## 2020-02-09 DIAGNOSIS — D250 Submucous leiomyoma of uterus: Secondary | ICD-10-CM | POA: Diagnosis not present

## 2020-02-09 DIAGNOSIS — D251 Intramural leiomyoma of uterus: Secondary | ICD-10-CM | POA: Diagnosis not present

## 2020-02-09 DIAGNOSIS — Z791 Long term (current) use of non-steroidal anti-inflammatories (NSAID): Secondary | ICD-10-CM | POA: Insufficient documentation

## 2020-02-09 DIAGNOSIS — N841 Polyp of cervix uteri: Secondary | ICD-10-CM | POA: Insufficient documentation

## 2020-02-09 HISTORY — PX: HYSTEROSCOPY WITH D & C: SHX1775

## 2020-02-09 HISTORY — DX: Postmenopausal bleeding: N95.0

## 2020-02-09 HISTORY — PX: OPERATIVE ULTRASOUND: SHX5996

## 2020-02-09 LAB — CBC
HCT: 40.6 % (ref 36.0–46.0)
Hemoglobin: 13.5 g/dL (ref 12.0–15.0)
MCH: 29 pg (ref 26.0–34.0)
MCHC: 33.3 g/dL (ref 30.0–36.0)
MCV: 87.3 fL (ref 80.0–100.0)
Platelets: 257 10*3/uL (ref 150–400)
RBC: 4.65 MIL/uL (ref 3.87–5.11)
RDW: 13 % (ref 11.5–15.5)
WBC: 5.4 10*3/uL (ref 4.0–10.5)
nRBC: 0 % (ref 0.0–0.2)

## 2020-02-09 SURGERY — DILATATION AND CURETTAGE /HYSTEROSCOPY
Anesthesia: General | Site: Vagina

## 2020-02-09 MED ORDER — FENTANYL CITRATE (PF) 100 MCG/2ML IJ SOLN: 25.0000 ug | INTRAMUSCULAR | Status: DC | PRN

## 2020-02-09 MED ORDER — ACETAMINOPHEN 160 MG/5ML PO SOLN: 325.0000 mg | ORAL | Status: DC | PRN

## 2020-02-09 MED ORDER — ONDANSETRON HCL 4 MG/2ML IJ SOLN
INTRAMUSCULAR | Status: DC | PRN
  Administered 2020-02-09: 4 mg via INTRAVENOUS

## 2020-02-09 MED ORDER — SODIUM CHLORIDE 0.9 % IR SOLN
Status: DC | PRN
  Administered 2020-02-09: 3000 mL

## 2020-02-09 MED ORDER — ONDANSETRON HCL 4 MG/2ML IJ SOLN: 4.0000 mg | Freq: Once | INTRAMUSCULAR | Status: DC | PRN

## 2020-02-09 MED ORDER — OXYCODONE HCL 5 MG PO TABS: 5.0000 mg | ORAL_TABLET | Freq: Once | ORAL | Status: DC | PRN

## 2020-02-09 MED ORDER — CEFAZOLIN SODIUM 1 G IJ SOLR
INTRAMUSCULAR | Status: AC
  Filled 2020-02-09: qty 20

## 2020-02-09 MED ORDER — MIDAZOLAM HCL 2 MG/2ML IJ SOLN
INTRAMUSCULAR | Status: DC | PRN
  Administered 2020-02-09 (×2): 1 mg via INTRAVENOUS

## 2020-02-09 MED ORDER — MEPERIDINE HCL 25 MG/ML IJ SOLN: 6.2500 mg | INTRAMUSCULAR | Status: DC | PRN

## 2020-02-09 MED ORDER — FENTANYL CITRATE (PF) 100 MCG/2ML IJ SOLN
INTRAMUSCULAR | Status: AC
  Filled 2020-02-09: qty 2

## 2020-02-09 MED ORDER — ONDANSETRON HCL 4 MG/2ML IJ SOLN
INTRAMUSCULAR | Status: AC
  Filled 2020-02-09: qty 2

## 2020-02-09 MED ORDER — KETOROLAC TROMETHAMINE 30 MG/ML IJ SOLN
INTRAMUSCULAR | Status: AC
  Filled 2020-02-09: qty 1

## 2020-02-09 MED ORDER — FENTANYL CITRATE (PF) 100 MCG/2ML IJ SOLN
INTRAMUSCULAR | Status: DC | PRN
  Administered 2020-02-09: 50 ug via INTRAVENOUS

## 2020-02-09 MED ORDER — PROPOFOL 10 MG/ML IV BOLUS
INTRAVENOUS | Status: DC | PRN
  Administered 2020-02-09: 100 mg via INTRAVENOUS

## 2020-02-09 MED ORDER — LIDOCAINE 2% (20 MG/ML) 5 ML SYRINGE
INTRAMUSCULAR | Status: DC | PRN
  Administered 2020-02-09: 50 mg via INTRAVENOUS

## 2020-02-09 MED ORDER — DEXAMETHASONE SODIUM PHOSPHATE 10 MG/ML IJ SOLN
INTRAMUSCULAR | Status: DC | PRN
  Administered 2020-02-09: 5 mg via INTRAVENOUS

## 2020-02-09 MED ORDER — KETOROLAC TROMETHAMINE 30 MG/ML IJ SOLN
INTRAMUSCULAR | Status: DC | PRN
Start: 2020-02-09 — End: 2020-02-09
  Administered 2020-02-09: 30 mg via INTRAVENOUS

## 2020-02-09 MED ORDER — ACETAMINOPHEN 325 MG PO TABS: 325.0000 mg | ORAL_TABLET | ORAL | Status: DC | PRN

## 2020-02-09 MED ORDER — LACTATED RINGERS IV SOLN
INTRAVENOUS | Status: DC
  Administered 2020-02-09: 50 mL via INTRAVENOUS

## 2020-02-09 MED ORDER — CEFAZOLIN SODIUM-DEXTROSE 1-4 GM/50ML-% IV SOLN
INTRAVENOUS | Status: DC | PRN
Start: 2020-02-09 — End: 2020-02-09
  Administered 2020-02-09: 2 g via INTRAVENOUS

## 2020-02-09 MED ORDER — MIDAZOLAM HCL 2 MG/2ML IJ SOLN
INTRAMUSCULAR | Status: AC
  Filled 2020-02-09: qty 2

## 2020-02-09 MED ORDER — OXYCODONE HCL 5 MG/5ML PO SOLN: 5.0000 mg | Freq: Once | ORAL | Status: DC | PRN

## 2020-02-09 MED ORDER — PROPOFOL 10 MG/ML IV BOLUS
INTRAVENOUS | Status: AC
  Filled 2020-02-09: qty 20

## 2020-02-09 MED ORDER — PHENYLEPHRINE 40 MCG/ML (10ML) SYRINGE FOR IV PUSH (FOR BLOOD PRESSURE SUPPORT)
PREFILLED_SYRINGE | INTRAVENOUS | Status: AC
  Filled 2020-02-09: qty 10

## 2020-02-09 MED ORDER — POVIDONE-IODINE 10 % EX SWAB: 2.0000 "application " | Freq: Once | CUTANEOUS | Status: DC

## 2020-02-09 MED ORDER — DEXAMETHASONE SODIUM PHOSPHATE 10 MG/ML IJ SOLN
INTRAMUSCULAR | Status: AC
  Filled 2020-02-09: qty 1

## 2020-02-09 MED ORDER — LIDOCAINE HCL 1 % IJ SOLN
INTRAMUSCULAR | Status: AC
  Filled 2020-02-09: qty 20

## 2020-02-09 SURGICAL SUPPLY — 19 items
BIPOLAR CUTTING LOOP 21FR (ELECTRODE) ×1
CANISTER SUCT 3000ML PPV (MISCELLANEOUS) ×2 IMPLANT
COVER WAND RF STERILE (DRAPES) ×2 IMPLANT
DEVICE MYOSURE REACH (MISCELLANEOUS) ×1 IMPLANT
DILATOR CANAL MILEX (MISCELLANEOUS) IMPLANT
DRSG TELFA 3X8 NADH (GAUZE/BANDAGES/DRESSINGS) IMPLANT
GLOVE SURG SS PI 7.0 STRL IVOR (GLOVE) ×2 IMPLANT
GOWN STRL REUS W/ TWL LRG LVL3 (GOWN DISPOSABLE) ×1 IMPLANT
GOWN STRL REUS W/ TWL XL LVL3 (GOWN DISPOSABLE) ×1 IMPLANT
GOWN STRL REUS W/TWL LRG LVL3 (GOWN DISPOSABLE) ×2
GOWN STRL REUS W/TWL XL LVL3 (GOWN DISPOSABLE) ×2
KIT PROCEDURE FLUENT (KITS) ×2 IMPLANT
KIT TURNOVER CYSTO (KITS) ×2 IMPLANT
LOOP CUTTING BIPOLAR 21FR (ELECTRODE) ×1 IMPLANT
PACK VAGINAL MINOR WOMEN LF (CUSTOM PROCEDURE TRAY) ×2 IMPLANT
PAD DRESSING TELFA 3X8 NADH (GAUZE/BANDAGES/DRESSINGS) IMPLANT
PAD OB MATERNITY 4.3X12.25 (PERSONAL CARE ITEMS) ×2 IMPLANT
TOWEL OR 17X26 10 PK STRL BLUE (TOWEL DISPOSABLE) ×2 IMPLANT
WATER STERILE IRR 500ML POUR (IV SOLUTION) ×2 IMPLANT

## 2020-02-09 NOTE — Anesthesia Postprocedure Evaluation (Signed)
Anesthesia Post Note  Patient: Candace Bailey  Procedure(s) Performed: DILATATION AND CURETTAGE /HYSTEROSCOPY (N/A Vagina ) OPERATIVE ULTRASOUND (N/A Vagina )     Patient location during evaluation: PACU Anesthesia Type: General Level of consciousness: awake and alert Pain management: pain level controlled Vital Signs Assessment: post-procedure vital signs reviewed and stable Respiratory status: spontaneous breathing, nonlabored ventilation, respiratory function stable and patient connected to nasal cannula oxygen Cardiovascular status: blood pressure returned to baseline and stable Postop Assessment: no apparent nausea or vomiting Anesthetic complications: no   No complications documented.  Last Vitals:  Vitals:   02/09/20 1345 02/09/20 1400  BP: 121/76 125/73  Pulse: (!) 55 63  Resp: 13 16  Temp:    SpO2: 100% 98%    Last Pain:  Vitals:   02/09/20 1330  TempSrc:   PainSc: 0-No pain                 Abena Erdman
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
## 2020-02-09 NOTE — Transfer of Care (Signed)
Immediate Anesthesia Transfer of Care Note  Patient: Candace Bailey  Procedure(s) Performed: Procedure(s) (LRB): DILATATION AND CURETTAGE /HYSTEROSCOPY (N/A) OPERATIVE ULTRASOUND (N/A)  Patient Location: PACU  Anesthesia Type: General  Level of Consciousness: awake, oriented, sedated and patient cooperative  Airway & Oxygen Therapy: Patient Spontanous Breathing and Patient connected to face mask oxygen  Post-op Assessment: Report given to PACU RN and Post -op Vital signs reviewed and stable  Post vital signs: Reviewed and stable  Complications: No apparent anesthesia complications  Last Vitals:  Vitals Value Taken Time  BP 128/73 02/09/20 1300  Temp    Pulse 78 02/09/20 1302  Resp 14 02/09/20 1302  SpO2 100 % 02/09/20 1302  Vitals shown include unvalidated device data.  Last Pain:  Vitals:   02/09/20 1126  TempSrc: Oral  PainSc: 0-No pain      Patients Stated Pain Goal: 3 (34/03/70 9643)  Complications: No complications documented.

## 2020-02-09 NOTE — Discharge Instructions (Signed)
  Post Anesthesia Home Care Instructions  Activity: Get plenty of rest for the remainder of the day. A responsible individual must stay with you for 24 hours following the procedure.  For the next 24 hours, DO NOT: -Drive a car -Paediatric nurse -Drink alcoholic beverages -Take any medication unless instructed by your physician -Make any legal decisions or sign important papers.  Meals: Start with liquid foods such as gelatin or soup. Progress to regular foods as tolerated. Avoid greasy, spicy, heavy foods. If nausea and/or vomiting occur, drink only clear liquids until the nausea and/or vomiting subsides. Call your physician if vomiting continues.  Special Instructions/Symptoms: Your throat may feel dry or sore from the anesthesia or the breathing tube placed in your throat during surgery. If this causes discomfort, gargle with warm salt water. The discomfort should disappear within 24 hours.  DISCHARGE INSTRUCTIONS: HYSTEROSCOPY The following instructions have been prepared to help you care for yourself upon your return home.   May take stool softner while taking narcotic pain medication to prevent constipation.  Drink plenty of water.  Personal hygiene: Marland Kitchen Use sanitary pads for vaginal drainage, not tampons. . Shower the day after your procedure. . NO tub baths, pools or Jacuzzis for 2-3 weeks. . Wipe front to back after using the bathroom.  Activity and limitations: . Do NOT drive or operate any equipment for 24 hours. The effects of anesthesia are still present and drowsiness may result. . Do NOT rest in bed all day. . Walking is encouraged. . Walk up and down stairs slowly. . You may resume your normal activity in one to two days or as indicated by your physician. Sexual activity: NO intercourse for at least 2 weeks after the procedure, or as indicated by your Doctor.  Diet: Eat a light meal as desired this evening. You may resume your usual diet tomorrow.  Return to  Work: You may resume your work activities in one to two days or as indicated by Marine scientist.  What to expect after your surgery: Expect to have vaginal bleeding/discharge for 2-3 days and spotting for up to 10 days. It is not unusual to have soreness for up to 1-2 weeks. You may have a slight burning sensation when you urinate for the first day. Mild cramps may continue for a couple of days. You may have a regular period in 2-6 weeks.  Call your doctor for any of the following: . Excessive vaginal bleeding or clotting, saturating and changing one pad every hour. . Inability to urinate 6 hours after discharge from hospital. . Pain not relieved by pain medication. . Fever of 100.4 F or greater. . Unusual vaginal discharge or odor.
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
## 2020-02-09 NOTE — Anesthesia Procedure Notes (Signed)
Procedure Name: LMA Insertion Date/Time: 02/09/2020 12:21 PM Performed by: Suan Halter, CRNA Pre-anesthesia Checklist: Patient identified, Emergency Drugs available, Suction available and Patient being monitored Patient Re-evaluated:Patient Re-evaluated prior to induction Oxygen Delivery Method: Circle system utilized Preoxygenation: Pre-oxygenation with 100% oxygen Induction Type: IV induction Ventilation: Mask ventilation without difficulty LMA: LMA inserted LMA Size: 4.0 Number of attempts: 1 Airway Equipment and Method: Bite block Placement Confirmation: positive ETCO2 Tube secured with: Tape Dental Injury: Teeth and Oropharynx as per pre-operative assessment

## 2020-02-10 ENCOUNTER — Encounter (HOSPITAL_BASED_OUTPATIENT_CLINIC_OR_DEPARTMENT_OTHER): Payer: Self-pay | Admitting: Obstetrics and Gynecology

## 2020-02-10 LAB — SURGICAL PATHOLOGY

## 2020-02-10 NOTE — Op Note (Signed)
Candace, Bailey MEDICAL RECORD [id number] ACCOUNT [id number] DATE OF BIRTH:1970/12/29 FACILITY: [facility] LOCATION: [facility] PHYSICIAN:Itali Mckendry Kathline Magic, MD  OPERATIVE REPORT  DATE OF PROCEDURE:  02/09/2020  PREOPERATIVE DIAGNOSES: 1.  Postmenopausal bleeding. 2.  Intolerance to pelvic exam.  POSTOPERATIVE DIAGNOSES: 1.  Postmenopausal bleeding. 2.  Intolerance to pelvic exam. 3.  Endometrial polyp.  PROCEDURE PERFORMED: 1.  Exam under anesthesia. 2.  Pelvic ultrasound. 3.  Hysteroscopy. 4.  Uterine polypectomy.  SURGEON:  Freda Munro, MD   ANESTHESIA:  General.  ANTIBIOTICS:  Ancef 2 g.  DRAINS:  Red rubber catheter to bladder.  SPECIMENS:  Polyp and uterine curetting sent to pathology.  COMPLICATIONS:  None.  ESTIMATED BLOOD LOSS:  Minimal.  DESCRIPTION OF PROCEDURE:  The patient was taken to the operating room where a general anesthetic was administered without difficulty.  She was placed in the dorsal lithotomy position and examined under anesthesia, revealed no pelvic masses.  She did  have a very narrow hymen, which allowed placement of the smallest finger into the vaginal cavity.  At this point, the patient underwent a pelvic ultrasound performed by the radiology service.  This revealed a possible polyp in the uterine cavity.  No  other abnormalities were noted.  Following this, the patient was prepped and draped.  A sterile speculum was placed in the vagina.  Cervix was serially dilated.  The hysteroscope was then advanced through the endocervical canal, which appeared to be  normal.  On entering the uterine cavity, the ostia were both visualized.  The patient did have a polyp arising in the lower uterine segment of the uterus posteriorly, approximately 7 o'clock on the hysteroscopic view.  At this point, the MyoSure device  was set up and a polypectomy performed without difficulty.  The entire polyp was removed, including the base.  Several passes around the  uterine cavity with the MyoSure was accomplished to obtain uterine curettings.  At this point, the MyoSure device was  removed.  The patient was awoken and taken to recovery room in stable condition.  She will be discharged to home.  She will follow up in the office in 4 weeks.  We will wait for the pathology.  VN/NUANCE  D:02/09/2020 T:02/09/2020 JOB:[id number]

## 2020-05-15 ENCOUNTER — Other Ambulatory Visit (HOSPITAL_COMMUNITY): Payer: Self-pay | Admitting: Internal Medicine

## 2020-05-15 DIAGNOSIS — E559 Vitamin D deficiency, unspecified: Secondary | ICD-10-CM | POA: Diagnosis not present

## 2020-05-15 DIAGNOSIS — Z0001 Encounter for general adult medical examination with abnormal findings: Secondary | ICD-10-CM | POA: Diagnosis not present

## 2020-05-15 DIAGNOSIS — W19XXXA Unspecified fall, initial encounter: Secondary | ICD-10-CM | POA: Diagnosis not present

## 2020-05-15 DIAGNOSIS — R058 Other specified cough: Secondary | ICD-10-CM | POA: Diagnosis not present

## 2020-05-15 DIAGNOSIS — G8110 Spastic hemiplegia affecting unspecified side: Secondary | ICD-10-CM | POA: Diagnosis not present

## 2020-05-15 DIAGNOSIS — N939 Abnormal uterine and vaginal bleeding, unspecified: Secondary | ICD-10-CM | POA: Diagnosis not present

## 2020-05-15 DIAGNOSIS — L309 Dermatitis, unspecified: Secondary | ICD-10-CM | POA: Diagnosis not present

## 2020-05-15 DIAGNOSIS — G8250 Quadriplegia, unspecified: Secondary | ICD-10-CM | POA: Diagnosis not present

## 2020-05-15 DIAGNOSIS — E669 Obesity, unspecified: Secondary | ICD-10-CM | POA: Diagnosis not present

## 2020-05-15 DIAGNOSIS — Z23 Encounter for immunization: Secondary | ICD-10-CM | POA: Diagnosis not present

## 2020-05-18 DIAGNOSIS — E559 Vitamin D deficiency, unspecified: Secondary | ICD-10-CM | POA: Diagnosis not present

## 2020-05-18 DIAGNOSIS — Z79899 Other long term (current) drug therapy: Secondary | ICD-10-CM | POA: Diagnosis not present

## 2020-05-18 DIAGNOSIS — W19XXXA Unspecified fall, initial encounter: Secondary | ICD-10-CM | POA: Diagnosis not present

## 2020-05-18 DIAGNOSIS — G8110 Spastic hemiplegia affecting unspecified side: Secondary | ICD-10-CM | POA: Diagnosis not present

## 2020-11-16 DIAGNOSIS — S0181XA Laceration without foreign body of other part of head, initial encounter: Secondary | ICD-10-CM | POA: Diagnosis not present

## 2020-11-16 DIAGNOSIS — L309 Dermatitis, unspecified: Secondary | ICD-10-CM | POA: Diagnosis not present

## 2020-11-16 DIAGNOSIS — R058 Other specified cough: Secondary | ICD-10-CM | POA: Diagnosis not present

## 2020-11-16 DIAGNOSIS — E669 Obesity, unspecified: Secondary | ICD-10-CM | POA: Diagnosis not present

## 2020-11-18 ENCOUNTER — Emergency Department (HOSPITAL_COMMUNITY): Payer: Medicare HMO

## 2020-11-18 ENCOUNTER — Other Ambulatory Visit: Payer: Self-pay

## 2020-11-18 ENCOUNTER — Emergency Department (HOSPITAL_COMMUNITY)
Admission: EM | Admit: 2020-11-18 | Discharge: 2020-11-18 | Disposition: A | Discharge status: Home / Self Care | Payer: Medicare HMO | Attending: Emergency Medicine | Admitting: Emergency Medicine

## 2020-11-18 ENCOUNTER — Encounter (HOSPITAL_COMMUNITY): Payer: Self-pay

## 2020-11-18 DIAGNOSIS — W19XXXA Unspecified fall, initial encounter: Secondary | ICD-10-CM | POA: Diagnosis not present

## 2020-11-18 DIAGNOSIS — S0101XA Laceration without foreign body of scalp, initial encounter: Secondary | ICD-10-CM | POA: Diagnosis not present

## 2020-11-18 DIAGNOSIS — W01198A Fall on same level from slipping, tripping and stumbling with subsequent striking against other object, initial encounter: Secondary | ICD-10-CM | POA: Insufficient documentation

## 2020-11-18 DIAGNOSIS — Z91040 Latex allergy status: Secondary | ICD-10-CM | POA: Insufficient documentation

## 2020-11-18 DIAGNOSIS — R9431 Abnormal electrocardiogram [ECG] [EKG]: Secondary | ICD-10-CM | POA: Diagnosis not present

## 2020-11-18 DIAGNOSIS — S0990XA Unspecified injury of head, initial encounter: Secondary | ICD-10-CM | POA: Diagnosis not present

## 2020-11-18 DIAGNOSIS — R58 Hemorrhage, not elsewhere classified: Secondary | ICD-10-CM | POA: Diagnosis not present

## 2020-11-18 DIAGNOSIS — M542 Cervicalgia: Secondary | ICD-10-CM | POA: Diagnosis not present

## 2020-11-18 DIAGNOSIS — I6381 Other cerebral infarction due to occlusion or stenosis of small artery: Secondary | ICD-10-CM | POA: Diagnosis not present

## 2020-11-18 DIAGNOSIS — Z743 Need for continuous supervision: Secondary | ICD-10-CM | POA: Diagnosis not present

## 2020-11-18 MED ORDER — ACETAMINOPHEN 500 MG PO TABS
1000.0000 mg | ORAL_TABLET | Freq: Once | ORAL | Status: AC
  Administered 2020-11-18: 1000 mg via ORAL
  Filled 2020-11-18: qty 2

## 2020-11-18 NOTE — ED Triage Notes (Signed)
BIB EMS for fall. Laceration in left lateral side of scalp, controlled on arrival. Per EMS, pt reported falling in her bedroom then crawled to the shower to clean up the blood. Per EMS, the pt reported falling at 1100.

## 2020-11-18 NOTE — ED Notes (Signed)
Pt ambulated and reported she feels good walking.
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
## 2020-11-18 NOTE — ED Notes (Signed)
Called for [facility] for transport at [facility]

## 2020-11-18 NOTE — ED Provider Notes (Signed)
Care transferred from previous provider at shift change. See note for full HPI.  In summation 50 year old here for evaluation of mechanical fall which occurred just PTA. Hx of TBI with hx of interrupted speech at baseline. Nonfocal Neuro exam.   Plan on FU on CT head/ cervical. If neg can likely dc home. Physical Exam  BP 111/69   Pulse 65   Temp 98.3 F (36.8 C) (Oral)   Resp 19   Ht 4\' 11"  (1.499 m)   Wt 63.5 kg   LMP  (LMP Unknown)   SpO2 99%   BMI 28.28 kg/m   Physical Exam Vitals and nursing note reviewed.  Constitutional:      General: She is not in acute distress.    Appearance: She is well-developed. She is not toxic-appearing or diaphoretic.  HENT:     Head: Atraumatic.  Eyes:     Pupils: Pupils are equal, round, and reactive to light.  Cardiovascular:     Rate and Rhythm: Normal rate and regular rhythm.  Pulmonary:     Effort: Pulmonary effort is normal. No respiratory distress.  Abdominal:     General: There is no distension.     Palpations: Abdomen is soft.  Musculoskeletal:        General: Normal range of motion.     Cervical back: Normal range of motion and neck supple.     Comments: Moves all 4 extremities.  Able to flex and extend at bilateral knees.  Pelvis stable, nontender palpation.  Skin:    General: Skin is warm and dry.  Neurological:     Mental Status: She is alert. Mental status is at baseline.     Comments: Ambulatory with walker     ED Course/Procedures     Procedures Labs Reviewed - No data to display  CT Head Wo Contrast  Result Date: 11/18/2020 CLINICAL DATA:  Pain after fall EXAM: CT HEAD WITHOUT CONTRAST CT CERVICAL SPINE WITHOUT CONTRAST TECHNIQUE: Multidetector CT imaging of the head and cervical spine was performed following the standard protocol without intravenous contrast. Multiplanar CT image reconstructions of the cervical spine were also generated. COMPARISON:  August 21, 2019 brain CT and August 12, 2015 cervical spine.  FINDINGS: CT HEAD FINDINGS Brain: No subdural, epidural, or subarachnoid hemorrhage. Ventricles and sulci are stable. Cerebellum, brainstem, and basal cisterns are stable. No mass effect or midline shift. A lacunar infarct is seen in the left thalamus, unchanged. No acute cortical ischemia or infarct. No other acute intracranial abnormalities. Vascular: No hyperdense vessel or unexpected calcification. Skull: Normal. Negative for fracture or focal lesion. Sinuses/Orbits: No acute finding. Other: 2 apparent skin staples on the left scalp on series 3, image 20. Extracranial soft tissues otherwise normal. CT CERVICAL SPINE FINDINGS Alignment: Reversal of normal lordosis centered at C5-6. Minimal anterolisthesis of C4 versus C5, unchanged since 2017. Skull base and vertebrae: No acute fracture. No primary bone lesion or focal pathologic process. Soft tissues and spinal canal: No prevertebral fluid or swelling. No visible canal hematoma. Disc levels: Multilevel degenerative disc disease. Facet degenerative changes. Upper chest: Negative. Other: No other abnormalities. IMPRESSION: 1. No acute intracranial abnormalities. 2. No fracture or traumatic malalignment in the cervical spine. Electronically Signed   By: Dorise Bullion III M.D   On: 11/18/2020 16:39   CT Cervical Spine Wo Contrast  Result Date: 11/18/2020 CLINICAL DATA:  Pain after fall EXAM: CT HEAD WITHOUT CONTRAST CT CERVICAL SPINE WITHOUT CONTRAST TECHNIQUE: Multidetector CT imaging of  the head and cervical spine was performed following the standard protocol without intravenous contrast. Multiplanar CT image reconstructions of the cervical spine were also generated. COMPARISON:  August 21, 2019 brain CT and August 12, 2015 cervical spine. FINDINGS: CT HEAD FINDINGS Brain: No subdural, epidural, or subarachnoid hemorrhage. Ventricles and sulci are stable. Cerebellum, brainstem, and basal cisterns are stable. No mass effect or midline shift. A lacunar  infarct is seen in the left thalamus, unchanged. No acute cortical ischemia or infarct. No other acute intracranial abnormalities. Vascular: No hyperdense vessel or unexpected calcification. Skull: Normal. Negative for fracture or focal lesion. Sinuses/Orbits: No acute finding. Other: 2 apparent skin staples on the left scalp on series 3, image 20. Extracranial soft tissues otherwise normal. CT CERVICAL SPINE FINDINGS Alignment: Reversal of normal lordosis centered at C5-6. Minimal anterolisthesis of C4 versus C5, unchanged since 2017. Skull base and vertebrae: No acute fracture. No primary bone lesion or focal pathologic process. Soft tissues and spinal canal: No prevertebral fluid or swelling. No visible canal hematoma. Disc levels: Multilevel degenerative disc disease. Facet degenerative changes. Upper chest: Negative. Other: No other abnormalities. IMPRESSION: 1. No acute intracranial abnormalities. 2. No fracture or traumatic malalignment in the cervical spine. Electronically Signed   By: Dorise Bullion III M.D   On: 11/18/2020 16:39   MDM  Here for mechanical fall. Plan on FU on head/cervical imaging with likely dc home if negative imaging.  CT head without acute abnormality CT cervical without acute abnormality  Patient ambulated with walker which she uses at baseline.  She is tolerating p.o. intake.  Laceration closed by previous provider.  She is at her baseline mentation.  DC home with strict return precautions.  Return for any worsening symptoms.   The patient has been appropriately medically screened and/or stabilized in the ED. I have low suspicion for any other emergent medical condition which would require further screening, evaluation or treatment in the ED or require inpatient management.  Patient is hemodynamically stable and in no acute distress.  Patient able to ambulate in department prior to ED.  Evaluation does not show acute pathology that would require ongoing or additional  emergent interventions while in the emergency department or further inpatient treatment.  I have discussed the diagnosis with the patient and answered all questions.  Pain is been managed while in the emergency department and patient has no further complaints prior to discharge.  Patient is comfortable with plan discussed in room and is stable for discharge at this time.  I have discussed strict return precautions for returning to the emergency department.  Patient was encouraged to follow-up with PCP/specialist refer to at discharge.         Yaminah Clayborn A, PA-C 11/18/20 1817    Little, Wenda Overland, MD 11/18/20 [phone]

## 2020-11-18 NOTE — Discharge Instructions (Signed)
You were seen in the emergency department for a fall  Your laceration was repaired with 2 staples.  These need to be removed in the next 7 days.  Please keep bulky pressure dressing around the head for at least 24 hours.  You may then remove this at home.  Wash the wound with clean water and soap.  You may apply thin layer of antibiotic ointment to the wound.  Ice.  You can take 650 to 1000 mg of acetaminophen for pain.  Return to the ED for signs of infection of the wound, severe or new or sudden headache, visual changes, stroke symptoms, vomiting

## 2020-11-18 NOTE — ED Notes (Signed)
AVS reviewed by previous RN and provided to pt. Pt picked up by Dad instead of PTAR.

## 2020-11-24 ENCOUNTER — Other Ambulatory Visit: Payer: Self-pay | Admitting: *Deleted

## 2020-11-24 NOTE — Patient Outreach (Signed)
[place] [facility]) Care Management  11/24/2020  Candace Bailey 01/07/1971 [id number]   Referral Date: 4/28 Referral Source: Insurance Referral Reason: ED visit in the last week Insurance: [employer] attempt #1, unsuccessful, HIPAA compliant voice message left.  Plan: RN CM will send outreach letter and follow up within the next 3-4 business days.  [name], [place], MSN [place] [phone]

## 2020-11-29 ENCOUNTER — Other Ambulatory Visit: Payer: Self-pay | Admitting: *Deleted

## 2020-11-29 NOTE — Patient Outreach (Signed)
[facility]) Care Management  11/29/2020  Candace Bailey Nov 22, 1970 [id number]   Referral Date: 4/28 Referral Source: Insurance Referral Reason: ED visit in the last week Insurance: [employer] attempt #2, unsuccessful, HIPAA compliant voice message left.  Plan: RN CM will follow up within the next 3-4 business days.  [name], [place], MSN [place] [phone]

## 2020-12-04 ENCOUNTER — Other Ambulatory Visit: Payer: Self-pay | Admitting: *Deleted

## 2020-12-04 ENCOUNTER — Encounter: Payer: Self-pay | Admitting: *Deleted

## 2020-12-04 NOTE — Patient Outreach (Signed)
[facility]) Care Management  [place]  12/05/2020   Jordyan Hardiman September 19, 1970 [id number]   Referral Date:4/28 Referral Source:Insurance Referral Reason:ED visit in the last week [place] attempt #3, successful.  Identity verified.  This care manager introduced self and stated purpose of call.  [facility] care management services explained.    Social: Has traumatic brain injury but report she is independent in all ADL's.  Does not cook, state she uses [employer] to go out to eat daily.  Discussed having prepared meals delivered, declines stating she likes to stay out and busy in the community.  Inquired about involvement with community day centers, denies stating she does not like to be around other disabled people.  Declines information on community centers.    Conditions: Traumatic brain injury and frequent falls.  Uses walker for ambulation, state she fell in the bathroom over clothes in the floor.  Acknowledges that she should always keep a clear pathway for walking.  Medications: Does not take any prescription meds, uses Ibuprofen as needed for pain.  Appointments: State she has follow up with PCP in September.  Has not seen him since her fall, unable to remember when her last appointment was.  Offered to call office via conference call to confirm last appointment date, report fall and schedule sooner appointment, she declines.  Uses [employer] for transportation to appointments.   Encounter Medications:  Outpatient Encounter Medications as of 12/04/2020  Medication Sig  . ibuprofen (ADVIL,MOTRIN) 200 MG tablet Take 200 mg by mouth every 6 (six) hours as needed for moderate pain. pain   No facility-administered encounter medications on file as of 12/04/2020.    Functional Status:  In your present state of health, do you have any difficulty performing the following activities: 02/09/2020 02/09/2020  Hearing? - N  Vision? - N  Difficulty concentrating or  making decisions? - N  Walking or climbing stairs? - Y  Dressing or bathing? - N  Comment - tremors right hand, patient states she can take care of herself  Doing errands, shopping? N -  Comment uses [employer] -  Some recent data might be hidden    Fall/Depression Screening: Fall Risk  12/04/2020  Falls in the past year? 1  Number falls in past yr: 0  Injury with Fall? 0  Risk for fall due to : History of fall(s)  Follow up Falls evaluation completed;Education provided   PHQ 2/9 Scores 12/04/2020  PHQ - 2 Score 0    Assessment:  Goals Addressed            This Visit's Progress   . Make and Keep All Appointments       Timeframe:  Long-Range Goal Priority:  Medium Start Date:         5/9                    Expected End Date:   8/9                    Follow Up Date 5/26  Barriers: Transportation Other  - TBI    - call to cancel if needed - keep a calendar with prescription refill dates - use public transportation    Why is this important?    Part of staying healthy is seeing the doctor for follow-up care.   If you forget your appointments, there are some things you can do to stay on track.    Notes:  5/9 - Unsure when her last and next PCP visit is, declined to have this care manager call to schedule.  Encouraged to call office as soon as possible    . Prevent Falls and Injury       Follow Up Date 5/26   Timeframe:  Short-Term Goal Priority:  High Start Date:        5/9                     Expected End Date:   6/9  Barriers: Health Behaviors                      - always use handrails on the stairs - learn how to get back up if I fall - pick up clutter from the floors - use a cane or walker    Why is this important?    Most falls happen when it is hard for you to walk safely. Your balance may be off because of an illness. You may have pain in your knees, hip or other joints.   You may be overly tired or taking medicines that make you sleepy. You may  not be able to see or hear clearly.   Falls can lead to broken bones, bruises or other injuries.   There are things you can do to help prevent falling.     Notes:        Plan:  Follow-up:  Patient agrees to Care Plan and Follow-up.  Will send education regarding fall risk.  Will notify PCP of [facility] involvement.  Will follow up with member within the next 3 weeks.  [name], [place], MSN [place] [phone]

## 2020-12-05 ENCOUNTER — Other Ambulatory Visit: Payer: Self-pay

## 2020-12-05 ENCOUNTER — Ambulatory Visit (HOSPITAL_COMMUNITY)
Admission: EM | Admit: 2020-12-05 | Discharge: 2020-12-05 | Disposition: A | Discharge status: Home / Self Care | Payer: Medicare HMO | Attending: Emergency Medicine | Admitting: Emergency Medicine

## 2020-12-05 DIAGNOSIS — Z4802 Encounter for removal of sutures: Secondary | ICD-10-CM

## 2020-12-05 NOTE — ED Notes (Signed)
This RN removed two staples from left occipital area. No blood seen. Area healing well.

## 2020-12-05 NOTE — ED Triage Notes (Signed)
Pt presents today to have staples removed from head. She has two staples to top of head.

## 2020-12-05 NOTE — Patient Instructions (Signed)
Understanding Your Risk for Falls Each year, millions of people have serious injuries from falls. It is important to understand your risk for falling. Talk with your health care provider about your risk and what you can do to lower it. There are actions you can take at home to lower your risk and prevent falls. If you do have a serious fall, make sure to tell your health care provider. Falling once raises your risk of falling again. How can falls affect me? Serious injuries from falls are common. These include:  Broken bones, such as hip fractures.  Head injuries, such as traumatic brain injuries (TBI) or concussion. A fear of falling can cause you to avoid activities and stay at home. This can make your muscles weaker and actually raise your risk for a fall. What can increase my risk? There are a number of risk factors that increase your risk for falling. The more risk factors you have, the higher your risk of falling. Serious injuries from a fall happen most often to people older than age 51. Children and young adults ages 39-29 are also at higher risk. Common risk factors include:  Weakness in the lower body.  Lack (deficiency) of vitamin D.  Being generally weak or confused due to long-term (chronic) illness.  Dizziness or balance problems.  Poor vision.  Medicines that cause dizziness or drowsiness. These can include medicines for your blood pressure, heart, anxiety, insomnia, or edema, as well as pain medicines and muscle relaxants. Other risk factors include:  Drinking alcohol.  Having had a fall in the past.  Having depression.  Having foot pain or wearing improper footwear.  Working at a dangerous job.  Having any of the following in your home: ? Tripping hazards, such as floor clutter or loose rugs. ? Poor lighting. ? Pets.  Having dementia or memory loss. What actions can I take to lower my risk of falling? Physical activity Maintain physical fitness. Do  strength and balance exercises. Consider taking a regular class to build strength and balance. Yoga and tai chi are good options. Vision Have your eyes checked every year and your vision prescription updated as needed. Walking aids and footwear  Wear nonskid shoes. Do not wear high heels.  Do not walk around the house in socks or slippers.  Use a cane or walker as told by your health care provider. Home safety  Attach secure railings on both sides of your stairs.  Install grab bars for your tub, shower, and toilet. Use a bath mat in your tub or shower.  Use good lighting in all rooms. Keep a flashlight near your bed.  Make sure there is a clear path from your bed to the bathroom. Use night-lights.  Do not use throw rugs. Make sure all carpeting is taped or tacked down securely.  Remove all clutter from walkways and stairways, including extension cords.  Repair uneven or broken steps.  Avoid walking on icy or slippery surfaces. Walk on the grass instead of on icy or slick sidewalks. Use ice melt to get rid of ice on walkways.  Use a cordless phone.      Questions to ask your health care provider  Can you help me check my risk for a fall?  Do any of my medicines make me more likely to fall?  Should I take a vitamin D supplement?  What exercises can I do to improve my strength and balance?  Should I make an appointment to have my vision  checked?  Do I need a bone density test to check for weak bones or osteoporosis?  Would it help to use a cane or a walker? Where to find more information  Centers for Disease Control and Prevention, STEADI: www.cdc.gov  Community-Based Fall Prevention Programs: www.cdc.gov  National Institute on Aging: www.nia.nih.gov Contact a health care provider if:  You fall at home.  You are afraid of falling at home.  You feel weak, drowsy, or dizzy. Summary  Serious injuries from a fall happen most often to people older than age 65.  Children and young adults ages 15-29 are also at higher risk.  Talk with your health care provider about your risks for falling and how to lower those risks.  Taking certain precautions at home can lower your risk for falling.  If you fall, always tell your health care provider. This information is not intended to replace advice given to you by your health care provider. Make sure you discuss any questions you have with your health care provider. Document Revised: 02/16/2020 Document Reviewed: 02/16/2020 Elsevier Patient Education  2021 Elsevier Inc.  

## 2020-12-21 ENCOUNTER — Other Ambulatory Visit: Payer: Self-pay | Admitting: *Deleted

## 2020-12-21 NOTE — Patient Outreach (Signed)
[facility]) Care Management  12/21/2020  Candace Bailey 1971/01/30 [id number]   Outgoing call placed to member, no answer, HIPAA compliant voice message left.  Will follow up within the next 3-4 business days.  [name], [place], MSN [place] [phone]

## 2020-12-27 ENCOUNTER — Other Ambulatory Visit: Payer: Self-pay | Admitting: *Deleted

## 2020-12-27 ENCOUNTER — Ambulatory Visit: Payer: Self-pay | Admitting: *Deleted

## 2020-12-27 NOTE — Patient Outreach (Signed)
[facility]) Care Management  12/27/2020  Candace Bailey 12/08/1970 [id number]   Outgoing call placed to member, no answer, HIPAA compliant voice message left.  Will follow up within the next 3-4 business days.  [name], [place], MSN [place] [phone]

## 2021-01-01 ENCOUNTER — Other Ambulatory Visit: Payer: Self-pay | Admitting: *Deleted

## 2021-01-01 NOTE — Patient Outreach (Signed)
[facility]) Care Management  01/01/2021  Candace Bailey Jun 13, 1971 [id number]   Outreach attempt #3, unsuccessful, HIPAA compliant voice message left.  Will make 4th and final attempt within the next 4 weeks, if remain unsuccessful will close case due to inability to maintain contact.  [name], [place], MSN [place] [phone]

## 2021-01-02 ENCOUNTER — Other Ambulatory Visit: Payer: Self-pay | Admitting: *Deleted

## 2021-01-02 NOTE — Patient Outreach (Signed)
[facility]) Care Management  01/02/2021  Candace Bailey 05/19/1971 [id number]   Incoming call received from member after multiple missed calls. State she is doing well, denies any needs at this time.  Denies any urgent concerns, encouraged to contact this care manager with questions.  Agrees to follow up within the next 3 months.  Goals Addressed            This Visit's Progress   . [facility] - Make and Keep All Appointments   On track    Timeframe:  Long-Range Goal Priority:  Medium Start Date:         5/9                    Expected End Date:   8/9                    Follow Up Date 5/26  Barriers: Transportation Other  - TBI    - call to cancel if needed - keep a calendar with prescription refill dates - use public transportation    Why is this important?    Part of staying healthy is seeing the doctor for follow-up care.   If you forget your appointments, there are some things you can do to stay on track.    Notes:   5/9 - Unsure when her last and next PCP visit is, declined to have this care manager call to schedule.  Encouraged to call office as soon as possible  6/7 - Patient encouraged again to call PCP office for next appointment.      [name] - Prevent Falls and Injury   On track    Follow Up Date 5/26   Timeframe:  Short-Term Goal Priority:  High Start Date:        5/9                     Expected End Date:   6/9  Barriers: Health Behaviors                      - always use handrails on the stairs - learn how to get back up if I fall - pick up clutter from the floors - use a cane or walker    Why is this important?    Most falls happen when it is hard for you to walk safely. Your balance may be off because of an illness. You may have pain in your knees, hip or other joints.   You may be overly tired or taking medicines that make you sleepy. You may not be able to see or hear clearly.   Falls can lead to broken bones, bruises or other  injuries.   There are things you can do to help prevent falling.     Notes:   6/7 - Denies any recent falls, using walker for mobility      [name], Therapist, sports, MSN [place] Manager [phone]

## 2021-01-23 ENCOUNTER — Ambulatory Visit: Payer: Self-pay | Admitting: *Deleted

## 2021-01-31 ENCOUNTER — Ambulatory Visit: Payer: Medicare HMO

## 2021-02-06 ENCOUNTER — Ambulatory Visit: Payer: Medicare HMO | Admitting: Physical Therapy

## 2021-02-14 ENCOUNTER — Ambulatory Visit: Payer: Medicare HMO

## 2021-02-21 ENCOUNTER — Other Ambulatory Visit: Payer: Self-pay

## 2021-02-21 ENCOUNTER — Ambulatory Visit: Payer: Medicare HMO | Attending: Internal Medicine | Admitting: Occupational Therapy

## 2021-02-21 ENCOUNTER — Encounter: Payer: Self-pay | Admitting: Occupational Therapy

## 2021-02-21 DIAGNOSIS — R2681 Unsteadiness on feet: Secondary | ICD-10-CM | POA: Insufficient documentation

## 2021-02-21 DIAGNOSIS — M25622 Stiffness of left elbow, not elsewhere classified: Secondary | ICD-10-CM | POA: Diagnosis not present

## 2021-02-21 DIAGNOSIS — M6281 Muscle weakness (generalized): Secondary | ICD-10-CM

## 2021-02-21 DIAGNOSIS — M25612 Stiffness of left shoulder, not elsewhere classified: Secondary | ICD-10-CM | POA: Diagnosis not present

## 2021-02-21 DIAGNOSIS — R41840 Attention and concentration deficit: Secondary | ICD-10-CM | POA: Diagnosis not present

## 2021-02-21 DIAGNOSIS — I69053 Hemiplegia and hemiparesis following nontraumatic subarachnoid hemorrhage affecting right non-dominant side: Secondary | ICD-10-CM | POA: Insufficient documentation
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
## 2021-03-02 ENCOUNTER — Other Ambulatory Visit: Payer: Self-pay

## 2021-03-02 ENCOUNTER — Ambulatory Visit: Payer: Medicare HMO | Attending: Internal Medicine | Admitting: Occupational Therapy

## 2021-03-02 DIAGNOSIS — M25612 Stiffness of left shoulder, not elsewhere classified: Secondary | ICD-10-CM | POA: Insufficient documentation

## 2021-03-02 DIAGNOSIS — M25622 Stiffness of left elbow, not elsewhere classified: Secondary | ICD-10-CM

## 2021-03-02 DIAGNOSIS — M6281 Muscle weakness (generalized): Secondary | ICD-10-CM | POA: Insufficient documentation

## 2021-03-02 DIAGNOSIS — I69053 Hemiplegia and hemiparesis following nontraumatic subarachnoid hemorrhage affecting right non-dominant side: Secondary | ICD-10-CM | POA: Insufficient documentation

## 2021-03-02 DIAGNOSIS — R2681 Unsteadiness on feet: Secondary | ICD-10-CM | POA: Diagnosis not present

## 2021-03-02 DIAGNOSIS — R41840 Attention and concentration deficit: Secondary | ICD-10-CM | POA: Insufficient documentation

## 2021-03-02 NOTE — Patient Instructions (Signed)
Access Code: [email or web address]  URL: [email or web address]  Date: 02/21/2021  Prepared by: Waldo Laine    Exercises     Putty Squeezes - 1 x daily - 7 x weekly - 3 sets - 10 reps   Rolling Putty on Table - 1 x daily - 7 x weekly - 3 sets - 10 reps   Thumb Opposition with Putty - 1 x daily - 7 x weekly - 3 sets - 10 reps   Finger Extension with Putty - 1 x daily - 7 x weekly - 3 sets - 10 reps   Finger Lumbricals with Putty - 1 x daily - 7 x weekly - 3 sets - 10 reps
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
## 2021-03-05 ENCOUNTER — Other Ambulatory Visit: Payer: Self-pay

## 2021-03-05 ENCOUNTER — Encounter: Payer: Self-pay | Admitting: Occupational Therapy

## 2021-03-05 ENCOUNTER — Ambulatory Visit: Payer: Medicare HMO | Admitting: Occupational Therapy

## 2021-03-05 DIAGNOSIS — R41840 Attention and concentration deficit: Secondary | ICD-10-CM

## 2021-03-05 DIAGNOSIS — I69053 Hemiplegia and hemiparesis following nontraumatic subarachnoid hemorrhage affecting right non-dominant side: Secondary | ICD-10-CM

## 2021-03-05 DIAGNOSIS — M25622 Stiffness of left elbow, not elsewhere classified: Secondary | ICD-10-CM | POA: Diagnosis not present

## 2021-03-05 DIAGNOSIS — M6281 Muscle weakness (generalized): Secondary | ICD-10-CM | POA: Diagnosis not present

## 2021-03-05 DIAGNOSIS — M25612 Stiffness of left shoulder, not elsewhere classified: Secondary | ICD-10-CM

## 2021-03-05 DIAGNOSIS — R2681 Unsteadiness on feet: Secondary | ICD-10-CM
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
## 2021-03-08 ENCOUNTER — Telehealth: Payer: Self-pay | Admitting: Neurology

## 2021-03-08 NOTE — Telephone Encounter (Signed)
Per Epic, our referral department cancelled her new patient appt (visit was scheduled on 05/14/21 - cancelled on 02/20/21). It was noted that patient is seeing another office.

## 2021-03-08 NOTE — Telephone Encounter (Signed)
Patients Mother Candace Bailey [phone] wanted to speak to Dr. Krista Blue about her daughters upcoming visit 10.17.2022. The Mother stated that the daughter is a complicated case and that she wanted to add some insight. I advised the Mother she was not on the DPR so we would not be able to discuss any medical information with her. I asked if she would be coming to the visit she said no because her daughter is independent and likes to do things on her own. Mother has advised she would like her daughter to have OT to help with daily living skills. Just wanted to document this interaction in the lobby. Thank you

## 2021-03-13 ENCOUNTER — Other Ambulatory Visit: Payer: Self-pay

## 2021-03-13 ENCOUNTER — Encounter: Payer: Self-pay | Admitting: Occupational Therapy

## 2021-03-13 ENCOUNTER — Ambulatory Visit: Payer: Medicare HMO | Admitting: Occupational Therapy

## 2021-03-13 ENCOUNTER — Telehealth: Payer: Self-pay | Admitting: Occupational Therapy

## 2021-03-13 VITALS — BP 112/76 | HR 61

## 2021-03-13 DIAGNOSIS — I69053 Hemiplegia and hemiparesis following nontraumatic subarachnoid hemorrhage affecting right non-dominant side: Secondary | ICD-10-CM | POA: Diagnosis not present

## 2021-03-13 DIAGNOSIS — M25612 Stiffness of left shoulder, not elsewhere classified: Secondary | ICD-10-CM

## 2021-03-13 DIAGNOSIS — R2681 Unsteadiness on feet: Secondary | ICD-10-CM | POA: Diagnosis not present

## 2021-03-13 DIAGNOSIS — M6281 Muscle weakness (generalized): Secondary | ICD-10-CM | POA: Diagnosis not present

## 2021-03-13 DIAGNOSIS — M25622 Stiffness of left elbow, not elsewhere classified: Secondary | ICD-10-CM | POA: Diagnosis not present

## 2021-03-13 DIAGNOSIS — R41840 Attention and concentration deficit: Secondary | ICD-10-CM
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
## 2021-03-19 ENCOUNTER — Ambulatory Visit: Payer: Medicare HMO | Admitting: Occupational Therapy

## 2021-04-03 ENCOUNTER — Other Ambulatory Visit: Payer: Self-pay | Admitting: *Deleted

## 2021-04-03 NOTE — Patient Outreach (Signed)
[facility]) Care Management  [place]  04/03/2021   Candace Bailey 01-08-1971 [id number]   Incoming call received back, state she is doing well.  Denies any urgent concerns, encouraged to contact this care manager with questions.    Encounter Medications:  Outpatient Encounter Medications as of 04/03/2021  Medication Sig   ibuprofen (ADVIL,MOTRIN) 200 MG tablet Take 200 mg by mouth every 6 (six) hours as needed for moderate pain. pain   No facility-administered encounter medications on file as of 04/03/2021.    Functional Status:  No flowsheet data found.  Fall/Depression Screening: Fall Risk  12/04/2020  Falls in the past year? 1  Number falls in past yr: 0  Injury with Fall? 0  Risk for fall due to : History of fall(s)  Follow up Falls evaluation completed;Education provided   PHQ 2/9 Scores 12/04/2020  PHQ - 2 Score 0    Assessment:   Care Plan Care Plan : General Plan of Care (Adult)  Updates made by [name], RN since 04/03/2021 12:00 AM     Problem: Health Promotion or Disease Self-Management (General Plan of Care)      Long-Range Goal: Self-Management Plan Developed Completed 04/03/2021  Start Date: 12/04/2020  Expected End Date: 03/05/2021  Recent Progress: On track  Priority: High  Note:   Evidence-based guidance:  Review biopsychosocial determinants of health screens.  Determine level of modifiable health risk.  Assess level of patient activation, level of readiness, importance and confidence to make changes.  Evoke change talk using open-ended questions, pros and cons, as well as looking forward.  Identify areas where behavior change may lead to improved health.  Partner with patient to develop a robust self-management plan that includes lifestyle factors, such as weight loss, exercise and healthy nutrition, as well as goals specific to disease risks.  Support patient and family/caregiver active participation in decision-making and  self-management plan.  Implement additional goals and interventions based on identified risk factors to reduce health risk.  Facilitate advance care planning.  Review need for preventive screening based on age, sex, family history and health history.   Notes:     Task: Mutually Develop and Royce Macadamia Achievement of Patient Goals Completed 04/03/2021  Due Date: 03/05/2021  Note:   Care Management Activities:    - choices provided - health risks reviewed - problem-solving facilitated - questions answered - readiness for change evaluated    Notes:       Goals Addressed             This Visit's Progress    [facility] - Make and Keep All Appointments   On track    Timeframe:  Long-Range Goal Priority:  Medium Start Date:         5/9                    Expected End Date:   8/9                     Barriers: Transportation Other  - TBI    - call to cancel if needed - keep a calendar with prescription refill dates - use public transportation    Why is this important?   Part of staying healthy is seeing the doctor for follow-up care.  If you forget your appointments, there are some things you can do to stay on track.    Notes:   5/9 - Unsure when her last and next PCP  visit is, declined to have this care manager call to schedule.  Encouraged to call office as soon as possible  6/7 - Patient encouraged again to call PCP office for next appointment.    9/6 - State all appointments are up to date, declines offer to call PCP office.       [facility] - Prevent Falls and Injury   On track    Timeframe:  Long-Range Goal Priority:  High Start Date:        9/6                   Expected End Date:   12/6  Barriers: Health Behaviors                      - always use handrails on the stairs - learn how to get back up if I fall - pick up clutter from the floors - use a cane or walker    Why is this important?   Most falls happen when it is hard for you to walk safely. Your balance may be  off because of an illness. You may have pain in your knees, hip or other joints.  You may be overly tired or taking medicines that make you sleepy. You may not be able to see or hear clearly.  Falls can lead to broken bones, bruises or other injuries.  There are things you can do to help prevent falling.     Notes:   6/7 - Denies any recent falls, using walker for mobility  9/6 - Report she has falls from "time to time" denies injury.  Does not want to consider senior centers or additional help in the home.  State her family is always attempting to help, which frustrates her.  Does not want help from family, would like to remain independent.        Plan:  Follow-up: Patient agrees to Care Plan and Follow-up. Follow-up in 3 month(s).  [name], [place], MSN [place] [phone]

## 2021-04-03 NOTE — Patient Outreach (Signed)
[facility]) Care Management  04/03/2021  Karli Westberry [birth year] [id number]   Outgoing call placed to member, no answer, HIPAA compliant voice message left.  Will follow up within the next 3-4 business days.  [name], [place], MSN [place] [phone]

## 2021-05-14 ENCOUNTER — Ambulatory Visit: Payer: Medicare HMO | Admitting: Neurology

## 2021-05-21 DIAGNOSIS — Z23 Encounter for immunization: Secondary | ICD-10-CM | POA: Diagnosis not present

## 2021-05-21 DIAGNOSIS — G8250 Quadriplegia, unspecified: Secondary | ICD-10-CM | POA: Diagnosis not present

## 2021-05-21 DIAGNOSIS — L309 Dermatitis, unspecified: Secondary | ICD-10-CM | POA: Diagnosis not present

## 2021-05-21 DIAGNOSIS — E559 Vitamin D deficiency, unspecified: Secondary | ICD-10-CM | POA: Diagnosis not present

## 2021-05-21 DIAGNOSIS — W19XXXA Unspecified fall, initial encounter: Secondary | ICD-10-CM | POA: Diagnosis not present

## 2021-05-21 DIAGNOSIS — R058 Other specified cough: Secondary | ICD-10-CM | POA: Diagnosis not present

## 2021-05-21 DIAGNOSIS — Z0000 Encounter for general adult medical examination without abnormal findings: Secondary | ICD-10-CM | POA: Diagnosis not present

## 2021-05-21 DIAGNOSIS — G8110 Spastic hemiplegia affecting unspecified side: Secondary | ICD-10-CM | POA: Diagnosis not present

## 2021-05-23 ENCOUNTER — Ambulatory Visit: Payer: Medicare Other | Admitting: Occupational Therapy

## 2021-05-23 DIAGNOSIS — G8250 Quadriplegia, unspecified: Secondary | ICD-10-CM | POA: Diagnosis not present

## 2021-05-23 DIAGNOSIS — E559 Vitamin D deficiency, unspecified: Secondary | ICD-10-CM | POA: Diagnosis not present

## 2021-05-25 DIAGNOSIS — E559 Vitamin D deficiency, unspecified: Secondary | ICD-10-CM | POA: Diagnosis not present

## 2021-05-28 ENCOUNTER — Ambulatory Visit: Payer: Medicare Other | Attending: Internal Medicine | Admitting: Occupational Therapy

## 2021-05-28 ENCOUNTER — Encounter: Payer: Self-pay | Admitting: Occupational Therapy

## 2021-05-28 ENCOUNTER — Other Ambulatory Visit: Payer: Self-pay

## 2021-05-28 DIAGNOSIS — R2681 Unsteadiness on feet: Secondary | ICD-10-CM

## 2021-05-28 DIAGNOSIS — I69053 Hemiplegia and hemiparesis following nontraumatic subarachnoid hemorrhage affecting right non-dominant side: Secondary | ICD-10-CM | POA: Diagnosis not present

## 2021-05-28 DIAGNOSIS — M25622 Stiffness of left elbow, not elsewhere classified: Secondary | ICD-10-CM | POA: Insufficient documentation

## 2021-05-28 DIAGNOSIS — M25612 Stiffness of left shoulder, not elsewhere classified: Secondary | ICD-10-CM | POA: Insufficient documentation

## 2021-05-28 DIAGNOSIS — M6281 Muscle weakness (generalized): Secondary | ICD-10-CM | POA: Insufficient documentation

## 2021-05-28 DIAGNOSIS — R41840 Attention and concentration deficit: Secondary | ICD-10-CM | POA: Diagnosis present

## 2021-05-28 NOTE — Therapy (Signed)
[facility] [address] [place], [place], [address] Phone: [phone]   Fax:  [phone]  May 28, 2021    No Recipients  Occupational Therapy Discharge Summary   Patient: Candace Bailey MRN: [id number] Date of Birth: 1971/01/28  Diagnosis: No diagnosis found.  Referring Provider (OT): Dr. Josetta Huddle    OCCUPATIONAL THERAPY DISCHARGE SUMMARY  Visits from Start of Care: 4  Current functional level related to goals / functional outcomes: Pt did not achieve goals - unable to assess - as they did not return since last visit.    Remaining deficits: See last note. Pt demonstrates significant safety concerns with ADLs and IADLs however presents with poor carryover and understanding and poor awareness into deficits limiting progress.    Education / Equipment: Education was provided re: safety, grip strengthening HEP although patient refused to use putty, coordination HEP  Plan: Patient agrees to discharge.  Patient goals were not met. Patient is being discharged due to not returning since the last visit.        Sincerely,  Zachery Conch, OT/L  CC No Recipients  [facility] [address] [place], [place], [address] Phone: [phone]   Fax:  [phone]  Patient: Candace Bailey MRN: [id number] Date of Birth: 1971/06/04
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
## 2021-06-05 ENCOUNTER — Other Ambulatory Visit: Payer: Self-pay

## 2021-06-05 ENCOUNTER — Encounter: Payer: Self-pay | Admitting: Occupational Therapy

## 2021-06-05 ENCOUNTER — Ambulatory Visit: Payer: Medicare Other | Attending: Internal Medicine | Admitting: Occupational Therapy

## 2021-06-05 DIAGNOSIS — R41840 Attention and concentration deficit: Secondary | ICD-10-CM | POA: Insufficient documentation

## 2021-06-05 DIAGNOSIS — M25612 Stiffness of left shoulder, not elsewhere classified: Secondary | ICD-10-CM | POA: Insufficient documentation

## 2021-06-05 DIAGNOSIS — I69053 Hemiplegia and hemiparesis following nontraumatic subarachnoid hemorrhage affecting right non-dominant side: Secondary | ICD-10-CM | POA: Insufficient documentation

## 2021-06-05 DIAGNOSIS — R2681 Unsteadiness on feet: Secondary | ICD-10-CM | POA: Insufficient documentation

## 2021-06-05 DIAGNOSIS — M25622 Stiffness of left elbow, not elsewhere classified: Secondary | ICD-10-CM | POA: Diagnosis present

## 2021-06-05 DIAGNOSIS — M6281 Muscle weakness (generalized): Secondary | ICD-10-CM | POA: Diagnosis present
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
## 2021-06-11 ENCOUNTER — Ambulatory Visit: Payer: Medicare Other | Admitting: Occupational Therapy

## 2021-06-11 ENCOUNTER — Other Ambulatory Visit: Payer: Self-pay

## 2021-06-11 ENCOUNTER — Encounter: Payer: Self-pay | Admitting: Occupational Therapy

## 2021-06-11 DIAGNOSIS — R41840 Attention and concentration deficit: Secondary | ICD-10-CM

## 2021-06-11 DIAGNOSIS — M25622 Stiffness of left elbow, not elsewhere classified: Secondary | ICD-10-CM

## 2021-06-11 DIAGNOSIS — R2681 Unsteadiness on feet: Secondary | ICD-10-CM | POA: Diagnosis not present

## 2021-06-11 DIAGNOSIS — M25612 Stiffness of left shoulder, not elsewhere classified: Secondary | ICD-10-CM

## 2021-06-11 DIAGNOSIS — M6281 Muscle weakness (generalized): Secondary | ICD-10-CM

## 2021-06-11 DIAGNOSIS — I69053 Hemiplegia and hemiparesis following nontraumatic subarachnoid hemorrhage affecting right non-dominant side: Secondary | ICD-10-CM
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
## 2021-06-19 ENCOUNTER — Ambulatory Visit: Payer: Medicare Other | Admitting: Occupational Therapy

## 2021-06-19 ENCOUNTER — Other Ambulatory Visit: Payer: Self-pay

## 2021-06-19 ENCOUNTER — Encounter: Payer: Self-pay | Admitting: Occupational Therapy

## 2021-06-19 DIAGNOSIS — M25612 Stiffness of left shoulder, not elsewhere classified: Secondary | ICD-10-CM

## 2021-06-19 DIAGNOSIS — M25622 Stiffness of left elbow, not elsewhere classified: Secondary | ICD-10-CM

## 2021-06-19 DIAGNOSIS — I69053 Hemiplegia and hemiparesis following nontraumatic subarachnoid hemorrhage affecting right non-dominant side: Secondary | ICD-10-CM

## 2021-06-19 DIAGNOSIS — R2681 Unsteadiness on feet: Secondary | ICD-10-CM

## 2021-06-19 DIAGNOSIS — M6281 Muscle weakness (generalized): Secondary | ICD-10-CM

## 2021-06-19 DIAGNOSIS — R41840 Attention and concentration deficit: Secondary | ICD-10-CM
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
## 2021-06-25 ENCOUNTER — Ambulatory Visit: Payer: Medicare Other | Admitting: Occupational Therapy

## 2021-06-25 ENCOUNTER — Other Ambulatory Visit: Payer: Self-pay | Admitting: *Deleted

## 2021-06-25 ENCOUNTER — Other Ambulatory Visit: Payer: Self-pay

## 2021-06-25 ENCOUNTER — Ambulatory Visit: Payer: Medicare Other

## 2021-06-25 DIAGNOSIS — M6281 Muscle weakness (generalized): Secondary | ICD-10-CM

## 2021-06-25 DIAGNOSIS — I69053 Hemiplegia and hemiparesis following nontraumatic subarachnoid hemorrhage affecting right non-dominant side: Secondary | ICD-10-CM

## 2021-06-25 DIAGNOSIS — R2681 Unsteadiness on feet: Secondary | ICD-10-CM | POA: Diagnosis not present

## 2021-06-25 DIAGNOSIS — M25622 Stiffness of left elbow, not elsewhere classified: Secondary | ICD-10-CM

## 2021-06-25 DIAGNOSIS — M25612 Stiffness of left shoulder, not elsewhere classified: Secondary | ICD-10-CM

## 2021-06-25 DIAGNOSIS — R41840 Attention and concentration deficit: Secondary | ICD-10-CM
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
## 2021-06-25 NOTE — Patient Outreach (Signed)
[facility]) Care Management  06/25/2021  Miela Desjardin [birth year] [phone]   Outgoing call placed to member, no answer, HIPAA compliant voice message left.  Will follow up within the next 3-5 business days.   [name], [place], MSN [place] [phone]
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
## 2021-06-28 ENCOUNTER — Ambulatory Visit: Payer: Self-pay | Admitting: *Deleted

## 2021-07-02 ENCOUNTER — Other Ambulatory Visit: Payer: Self-pay | Admitting: *Deleted

## 2021-07-02 NOTE — Patient Outreach (Signed)
[facility]) Care Management  07/02/2021  Candace Bailey 21-Mar-1971 [id number]   Outreach attempt #2, successful.  Member report she is doing well.  Denies any urgent concerns, encouraged to contact this care manager with questions.  Made aware that chronic care management team at PCP office will be following member going forward, she verbalizes understanding.  Advised that if she does decide to pursue having in home assistance that this can be discussed with PCP office directly.  Will close case at this time, will be active with external care management program.   Goals Addressed             This Visit's Progress    COMPLETED: [facility] - Make and Keep All Appointments       Timeframe:  Long-Range Goal Priority:  Medium Start Date:         5/9                    Expected End Date:   8/9                     Barriers: Transportation Other  - TBI    - call to cancel if needed - keep a calendar with prescription refill dates - use public transportation    Why is this important?   Part of staying healthy is seeing the doctor for follow-up care.  If you forget your appointments, there are some things you can do to stay on track.    Notes:   5/9 - Unsure when her last and next PCP visit is, declined to have this care manager call to schedule.  Encouraged to call office as soon as possible  6/7 - Patient encouraged again to call PCP office for next appointment.    9/6 - State all appointments are up to date, declines offer to call PCP office.    12/5 - Will follow up with PCP in January     COMPLETED: [facility] - Prevent Falls and Injury       Timeframe:  Long-Range Goal Priority:  High Start Date:        9/6                   Expected End Date:   12/6  Barriers: Health Behaviors                      - always use handrails on the stairs - learn how to get back up if I fall - pick up clutter from the floors - use a cane or walker    Why is this important?    Most falls happen when it is hard for you to walk safely. Your balance may be off because of an illness. You may have pain in your knees, hip or other joints.  You may be overly tired or taking medicines that make you sleepy. You may not be able to see or hear clearly.  Falls can lead to broken bones, bruises or other injuries.  There are things you can do to help prevent falling.     Notes:   6/7 - Denies any recent falls, using walker for mobility  9/6 - Report she has falls from "time to time" denies injury.  Does not want to consider senior centers or additional help in the home.  State her family is always attempting to help, which frustrates her.  Does not want help from family,  would like to remain independent.  12/5 - Denies any recent falls.  Continue to refuse to consider senior centers.  Will consider accepting assistance in the home but state she would only need the help with cooking.  Denies needing help otherwise.  She is not eligible for mobile meals due to age.         [name], [place], MSN [facility] [phone]

## 2021-07-03 DIAGNOSIS — H5213 Myopia, bilateral: Secondary | ICD-10-CM | POA: Diagnosis not present

## 2021-07-11 ENCOUNTER — Encounter: Payer: Self-pay | Admitting: Physical Therapy

## 2021-07-11 ENCOUNTER — Ambulatory Visit: Payer: Medicare Other | Attending: Internal Medicine | Admitting: Physical Therapy

## 2021-07-11 ENCOUNTER — Other Ambulatory Visit: Payer: Self-pay

## 2021-07-11 DIAGNOSIS — R2681 Unsteadiness on feet: Secondary | ICD-10-CM | POA: Diagnosis not present

## 2021-07-11 DIAGNOSIS — I69053 Hemiplegia and hemiparesis following nontraumatic subarachnoid hemorrhage affecting right non-dominant side: Secondary | ICD-10-CM | POA: Insufficient documentation

## 2021-07-11 DIAGNOSIS — M6281 Muscle weakness (generalized): Secondary | ICD-10-CM | POA: Insufficient documentation
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
## 2021-07-11 NOTE — Patient Instructions (Signed)
Access Code: [email or web address] URL: [email or web address] Date: 07/11/2021 Prepared by: Willow Ora  Exercises Seated Hamstring Stretch - 1 x daily - 5 x weekly - 1 sets - 3 reps - 30 hold Seated March - 1 x daily - 5 x weekly - 1 sets - 10 reps Seated Long Arc Quad - 1 x daily - 5 x weekly - 1 sets - 10 reps Sit to Stand with Counter Support - 1 x daily - 5 x weekly - 1 sets - 10 reps Standing Hip Abduction with Counter Support - 1 x daily - 5 x weekly - 2 sets - 5 reps

## 2021-07-16 ENCOUNTER — Ambulatory Visit: Payer: Medicare Other

## 2021-07-16 ENCOUNTER — Other Ambulatory Visit: Payer: Self-pay

## 2021-07-16 DIAGNOSIS — I69053 Hemiplegia and hemiparesis following nontraumatic subarachnoid hemorrhage affecting right non-dominant side: Secondary | ICD-10-CM | POA: Diagnosis not present

## 2021-07-16 DIAGNOSIS — M6281 Muscle weakness (generalized): Secondary | ICD-10-CM | POA: Diagnosis not present

## 2021-07-16 DIAGNOSIS — R2681 Unsteadiness on feet: Secondary | ICD-10-CM
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
## 2021-07-17 ENCOUNTER — Ambulatory Visit
Admission: RE | Admit: 2021-07-17 | Discharge: 2021-07-17 | Disposition: A | Discharge status: Home / Self Care | Payer: Medicare Other | Source: Ambulatory Visit | Attending: Internal Medicine | Admitting: Internal Medicine

## 2021-07-17 ENCOUNTER — Other Ambulatory Visit: Payer: Self-pay | Admitting: Internal Medicine

## 2021-07-17 DIAGNOSIS — M25511 Pain in right shoulder: Secondary | ICD-10-CM

## 2021-07-18 ENCOUNTER — Ambulatory Visit: Payer: Medicare Other

## 2021-07-26 ENCOUNTER — Other Ambulatory Visit: Payer: Self-pay

## 2021-07-26 ENCOUNTER — Ambulatory Visit: Payer: Medicare Other

## 2021-07-26 DIAGNOSIS — R2681 Unsteadiness on feet: Secondary | ICD-10-CM | POA: Diagnosis not present

## 2021-07-26 DIAGNOSIS — I69053 Hemiplegia and hemiparesis following nontraumatic subarachnoid hemorrhage affecting right non-dominant side: Secondary | ICD-10-CM | POA: Diagnosis not present

## 2021-07-26 DIAGNOSIS — M6281 Muscle weakness (generalized): Secondary | ICD-10-CM | POA: Diagnosis not present
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
## 2021-07-27 ENCOUNTER — Ambulatory Visit: Payer: Medicare Other

## 2021-07-27 DIAGNOSIS — R2681 Unsteadiness on feet: Secondary | ICD-10-CM | POA: Diagnosis not present

## 2021-07-27 DIAGNOSIS — M6281 Muscle weakness (generalized): Secondary | ICD-10-CM | POA: Diagnosis not present

## 2021-07-27 DIAGNOSIS — I69053 Hemiplegia and hemiparesis following nontraumatic subarachnoid hemorrhage affecting right non-dominant side: Secondary | ICD-10-CM | POA: Diagnosis not present
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
## 2021-07-31 ENCOUNTER — Ambulatory Visit: Payer: Commercial Managed Care - HMO | Admitting: Physical Therapy

## 2021-08-02 ENCOUNTER — Ambulatory Visit: Payer: Commercial Managed Care - HMO | Attending: Internal Medicine | Admitting: Physical Therapy

## 2021-08-02 ENCOUNTER — Encounter: Payer: Self-pay | Admitting: Physical Therapy

## 2021-08-02 ENCOUNTER — Other Ambulatory Visit: Payer: Self-pay

## 2021-08-02 DIAGNOSIS — I69053 Hemiplegia and hemiparesis following nontraumatic subarachnoid hemorrhage affecting right non-dominant side: Secondary | ICD-10-CM | POA: Insufficient documentation

## 2021-08-02 DIAGNOSIS — R2681 Unsteadiness on feet: Secondary | ICD-10-CM | POA: Insufficient documentation

## 2021-08-02 DIAGNOSIS — M6281 Muscle weakness (generalized): Secondary | ICD-10-CM | POA: Insufficient documentation
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
## 2021-08-07 ENCOUNTER — Ambulatory Visit: Payer: Commercial Managed Care - HMO | Admitting: Physical Therapy

## 2021-08-09 ENCOUNTER — Ambulatory Visit: Payer: Commercial Managed Care - HMO

## 2021-11-15 DIAGNOSIS — L280 Lichen simplex chronicus: Secondary | ICD-10-CM | POA: Diagnosis not present

## 2021-11-15 DIAGNOSIS — L821 Other seborrheic keratosis: Secondary | ICD-10-CM | POA: Diagnosis not present

## 2021-11-19 DIAGNOSIS — M25562 Pain in left knee: Secondary | ICD-10-CM | POA: Diagnosis not present

## 2021-11-19 DIAGNOSIS — R296 Repeated falls: Secondary | ICD-10-CM | POA: Diagnosis not present

## 2021-11-19 DIAGNOSIS — R252 Cramp and spasm: Secondary | ICD-10-CM | POA: Diagnosis not present

## 2021-11-19 DIAGNOSIS — G8250 Quadriplegia, unspecified: Secondary | ICD-10-CM | POA: Diagnosis not present

## 2022-01-09 DIAGNOSIS — M79605 Pain in left leg: Secondary | ICD-10-CM | POA: Diagnosis not present

## 2022-02-13 ENCOUNTER — Ambulatory Visit
Admission: RE | Admit: 2022-02-13 | Discharge: 2022-02-13 | Disposition: A | Discharge status: Home / Self Care | Payer: Medicare Other | Source: Ambulatory Visit | Attending: Internal Medicine | Admitting: Internal Medicine

## 2022-02-13 ENCOUNTER — Other Ambulatory Visit: Payer: Self-pay | Admitting: Internal Medicine

## 2022-02-13 DIAGNOSIS — M25552 Pain in left hip: Secondary | ICD-10-CM | POA: Diagnosis not present

## 2022-02-13 DIAGNOSIS — M25562 Pain in left knee: Secondary | ICD-10-CM

## 2022-02-25 ENCOUNTER — Encounter: Payer: Self-pay | Admitting: Emergency Medicine

## 2022-02-25 ENCOUNTER — Ambulatory Visit
Admission: EM | Admit: 2022-02-25 | Discharge: 2022-02-25 | Disposition: A | Discharge status: Home / Self Care | Payer: Medicare Other | Attending: Urgent Care | Admitting: Urgent Care

## 2022-02-25 DIAGNOSIS — M1712 Unilateral primary osteoarthritis, left knee: Secondary | ICD-10-CM

## 2022-02-25 DIAGNOSIS — G8250 Quadriplegia, unspecified: Secondary | ICD-10-CM

## 2022-02-25 DIAGNOSIS — M25362 Other instability, left knee: Secondary | ICD-10-CM | POA: Diagnosis not present

## 2022-02-25 DIAGNOSIS — M25562 Pain in left knee: Secondary | ICD-10-CM | POA: Diagnosis not present

## 2022-02-25 DIAGNOSIS — M1612 Unilateral primary osteoarthritis, left hip: Secondary | ICD-10-CM

## 2022-02-25 MED ORDER — PREDNISONE 20 MG PO TABS: ORAL_TABLET | ORAL | 0 refills | Status: DC

## 2022-02-25 NOTE — ED Triage Notes (Addendum)
Pt here with left knee buckling and pain x 4 months. Pt has no mechanism of injury, but does walk with an unsteady gait.
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
## 2022-04-02 DIAGNOSIS — M1712 Unilateral primary osteoarthritis, left knee: Secondary | ICD-10-CM | POA: Diagnosis not present

## 2022-04-02 DIAGNOSIS — M25552 Pain in left hip: Secondary | ICD-10-CM | POA: Diagnosis not present

## 2022-04-18 ENCOUNTER — Ambulatory Visit (INDEPENDENT_AMBULATORY_CARE_PROVIDER_SITE_OTHER): Payer: Medicare Other

## 2022-04-18 ENCOUNTER — Ambulatory Visit (INDEPENDENT_AMBULATORY_CARE_PROVIDER_SITE_OTHER): Payer: Medicare Other | Admitting: Orthopedic Surgery

## 2022-04-18 DIAGNOSIS — M25562 Pain in left knee: Secondary | ICD-10-CM | POA: Diagnosis not present

## 2022-04-18 DIAGNOSIS — G8929 Other chronic pain: Secondary | ICD-10-CM

## 2022-04-18 DIAGNOSIS — M25552 Pain in left hip: Secondary | ICD-10-CM

## 2022-04-28 ENCOUNTER — Encounter: Payer: Self-pay | Admitting: Orthopedic Surgery

## 2022-04-28 DIAGNOSIS — G8929 Other chronic pain: Secondary | ICD-10-CM

## 2022-04-28 DIAGNOSIS — M25562 Pain in left knee: Secondary | ICD-10-CM

## 2022-04-28 MED ORDER — METHYLPREDNISOLONE ACETATE 40 MG/ML IJ SUSP
40.0000 mg | INTRAMUSCULAR | Status: AC | PRN
  Administered 2022-04-28: 40 mg via INTRA_ARTICULAR

## 2022-04-28 MED ORDER — LIDOCAINE HCL (PF) 1 % IJ SOLN
5.0000 mL | INTRAMUSCULAR | Status: AC | PRN
  Administered 2022-04-28: 5 mL
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
## 2022-05-30 DIAGNOSIS — Z0000 Encounter for general adult medical examination without abnormal findings: Secondary | ICD-10-CM | POA: Diagnosis not present

## 2022-05-30 DIAGNOSIS — Z9181 History of falling: Secondary | ICD-10-CM | POA: Diagnosis not present

## 2022-05-30 DIAGNOSIS — Z23 Encounter for immunization: Secondary | ICD-10-CM | POA: Diagnosis not present

## 2022-05-30 DIAGNOSIS — G8250 Quadriplegia, unspecified: Secondary | ICD-10-CM | POA: Diagnosis not present

## 2022-05-30 DIAGNOSIS — E7800 Pure hypercholesterolemia, unspecified: Secondary | ICD-10-CM | POA: Diagnosis not present

## 2022-05-30 DIAGNOSIS — Z1211 Encounter for screening for malignant neoplasm of colon: Secondary | ICD-10-CM | POA: Diagnosis not present

## 2022-05-30 DIAGNOSIS — Z1231 Encounter for screening mammogram for malignant neoplasm of breast: Secondary | ICD-10-CM | POA: Diagnosis not present

## 2022-06-24 ENCOUNTER — Telehealth: Payer: Self-pay | Admitting: Orthopedic Surgery

## 2022-06-24 NOTE — Telephone Encounter (Signed)
Pt called in requesting to speak with you about the pain in her knees... Pt wouldn't provide any other information... Pt requesting callback.Marland KitchenMarland Kitchen

## 2022-06-24 NOTE — Telephone Encounter (Signed)
I called and lm on vm for pt to see if she wants to come tomorrow.

## 2022-06-25 NOTE — Telephone Encounter (Signed)
I called pt again to see if she was wanting to come to the office for eval of her knee pain. I left a message for her to call back and ask for me directly. I will be happy to answer any questions that she has or to make an appt for her to come in the office for eval.

## 2022-06-27 NOTE — Telephone Encounter (Signed)
I spoke with pt and she asked that I call her mother Khadijatou Borak and discuss the charges for her last office visit. I called and she states that there was a charge for "surgery" I advised there would be a procedure code for the injection that she received for her knee but if she would sent a copy of the bill to me I would be happy to have someone review this for her and call to advise what the charges mean. Will hold this message and await the copy of the bill.

## 2022-07-01 NOTE — Telephone Encounter (Signed)
Received EOB today brought to manager for review.

## 2022-07-03 NOTE — Telephone Encounter (Signed)
I called and sw pt's mother Stanton Kidney [phone] ok per pt. Advised that the charge on her EOB CPT code 20610 was for her injection the office that she received on 04/18/2022 and that the insurance company is still processing the charges. Voiced understanding and was pleased with the information that was provided and will call with any other questions or concerns.

## 2022-07-24 ENCOUNTER — Telehealth: Payer: Self-pay | Admitting: Orthopedic Surgery

## 2022-07-24 NOTE — Telephone Encounter (Signed)
Patient states her left hip is painful and patient refuses to speak to anyone other than [facility], I advised patient that Autumn would be back in office Tuesday 07/30/2022. She stated she is going to wait till she gets back.Marland Kitchen

## 2022-07-30 NOTE — Telephone Encounter (Signed)
I called pt to see if she would like to come in for an appt so Dr. Sharol Given can take a look at her hip and see what is bothering her. I left a message on vm for her to call back. Advised that I can open a time for Dr. Sharol Given to see her on Thursday or if she can not make that appt then we can see her next week. Asked for her to call back and let me know what it is that she can do.

## 2022-07-31 ENCOUNTER — Telehealth: Payer: Self-pay | Admitting: Orthopedic Surgery

## 2022-07-31 NOTE — Telephone Encounter (Signed)
I called pt and she is having severe left hip pain and at her last visit Dr. Sharol Given had discussed possible injection with Dr. Rolena Infante. Pt expressed that she would like to proceed with scheduling and so an appt was made for Tuesday 08/06/2021 at 2:30 advised that if she had any other questions to call and ask for me

## 2022-07-31 NOTE — Telephone Encounter (Signed)
Pt called requesting a call back from Autumn F. Pt did not leave a reason for a call back. Please call pt at [phone]

## 2022-08-06 ENCOUNTER — Ambulatory Visit: Payer: Medicare Other | Admitting: Sports Medicine

## 2022-08-09 ENCOUNTER — Ambulatory Visit: Payer: Medicare Other | Admitting: Sports Medicine

## 2022-08-13 ENCOUNTER — Ambulatory Visit (INDEPENDENT_AMBULATORY_CARE_PROVIDER_SITE_OTHER): Payer: Medicare Other | Admitting: Sports Medicine

## 2022-08-13 ENCOUNTER — Ambulatory Visit: Payer: Self-pay

## 2022-08-13 ENCOUNTER — Encounter: Payer: Self-pay | Admitting: Sports Medicine

## 2022-08-13 DIAGNOSIS — M25552 Pain in left hip: Secondary | ICD-10-CM | POA: Diagnosis not present

## 2022-08-13 DIAGNOSIS — M1612 Unilateral primary osteoarthritis, left hip: Secondary | ICD-10-CM | POA: Diagnosis not present

## 2022-08-13 MED ORDER — METHYLPREDNISOLONE ACETATE 40 MG/ML IJ SUSP
40.0000 mg | INTRAMUSCULAR | Status: AC | PRN
  Administered 2022-08-13: 40 mg via INTRA_ARTICULAR

## 2022-08-13 MED ORDER — LIDOCAINE HCL 1 % IJ SOLN
3.0000 mL | INTRAMUSCULAR | Status: AC | PRN
  Administered 2022-08-13: 3 mL
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
## 2022-08-16 DIAGNOSIS — H524 Presbyopia: Secondary | ICD-10-CM | POA: Diagnosis not present

## 2023-01-22 IMAGING — CT CT CERVICAL SPINE W/O CM
3 of 4 series · 13 of 33 positions shown, 16 images · non-contrast
Comparison: August 21, 2019 brain CT and August 12, 2015 cervical
spine.

CLINICAL DATA: Pain after fall

EXAM:
CT HEAD WITHOUT CONTRAST
CT CERVICAL SPINE WITHOUT CONTRAST
TECHNIQUE: Multidetector CT imaging of the head and cervical spine was
performed following the standard protocol without intravenous
contrast. Multiplanar CT image reconstructions of the cervical spine
were also generated.

[Series 4: c_spine 2.0 st · axial · 0.30mm/px · z∈[+982,+1128]mm · 5 of 103 slices shown, 7 images]
[im 15/103  soft-tissue]
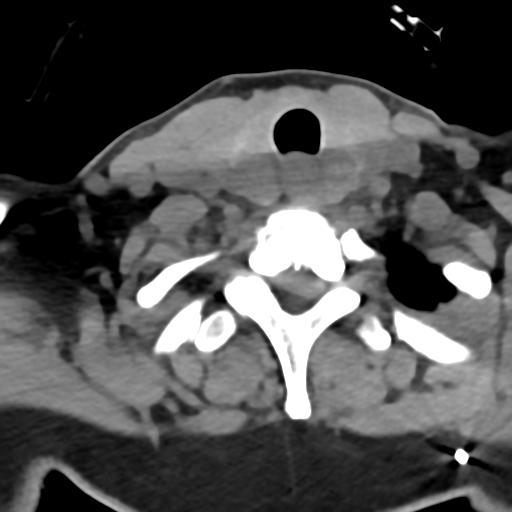
[im 15/103  bone]
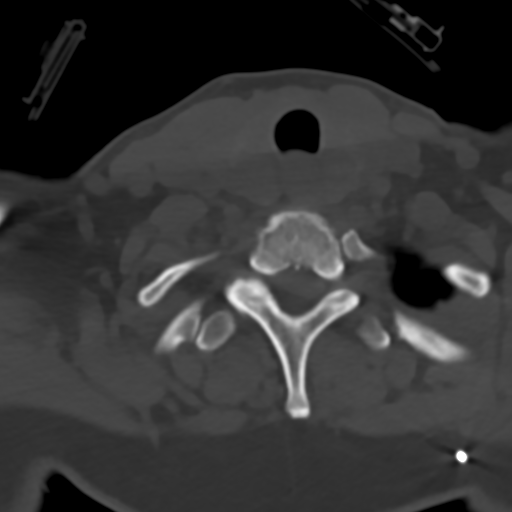
[im 30/103  bone]
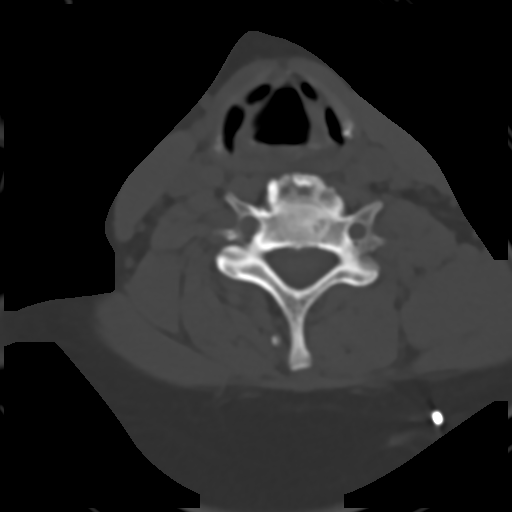
[im 59/103  bone]
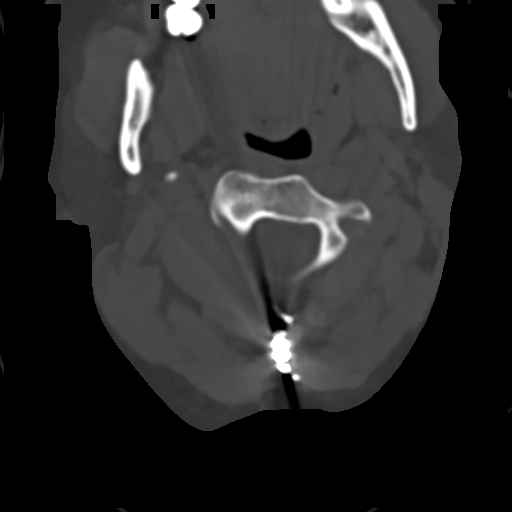
[im 73/103  bone]
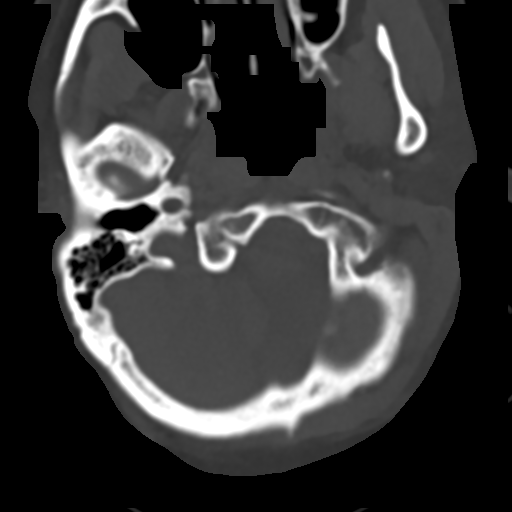
[im 88/103  soft-tissue]
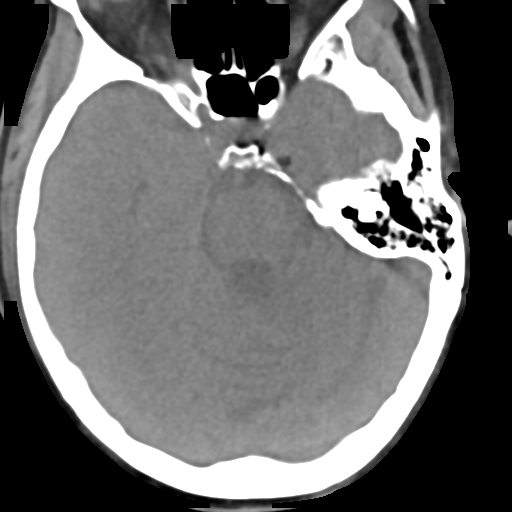
[im 88/103  bone]
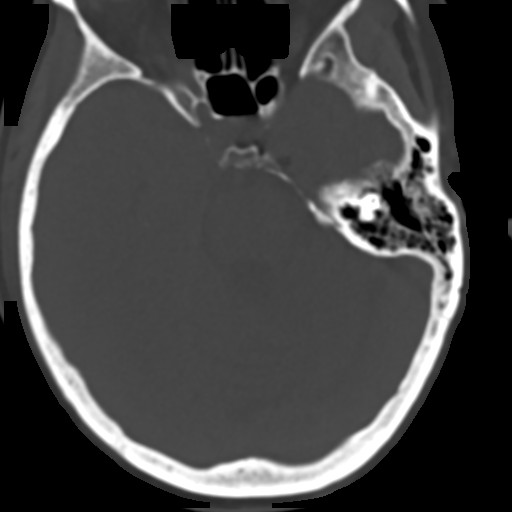

[Series 6: c_spine 2.0 sag bone · sagittal · 0.30mm/px · 5 of 61 slices shown, 6 images]
[im 21/61  bone]
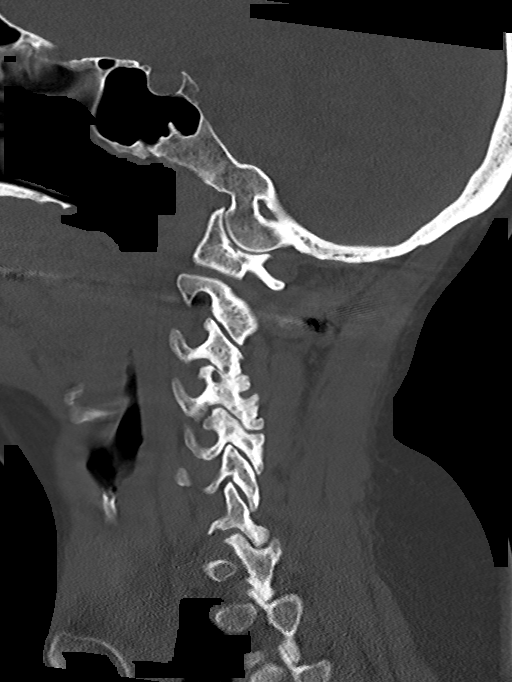
[im 26/61  bone]
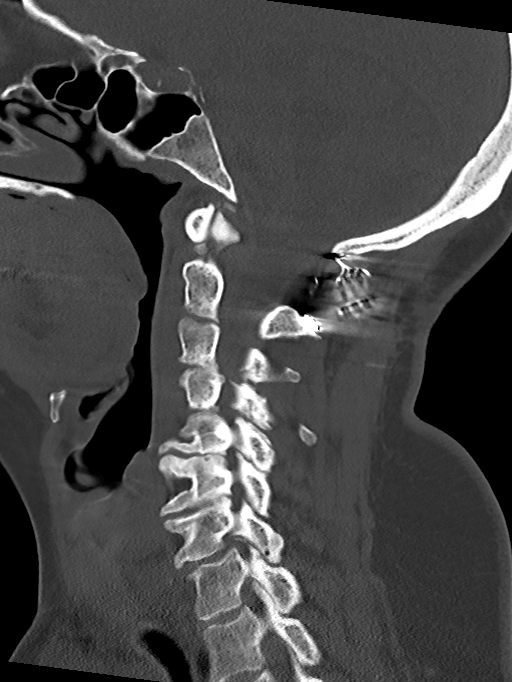
[im 31/61  soft-tissue]
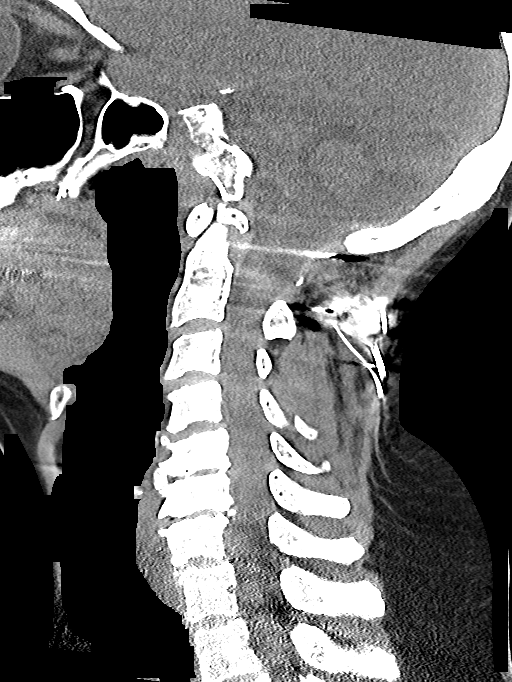
[im 31/61  bone]
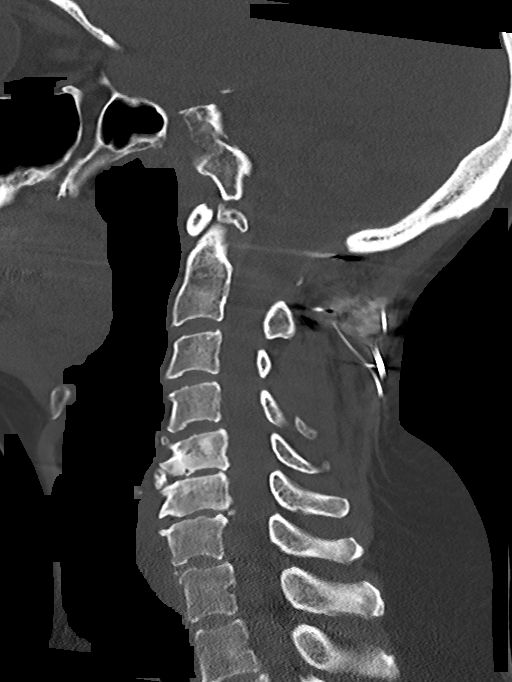
[im 36/61  bone]
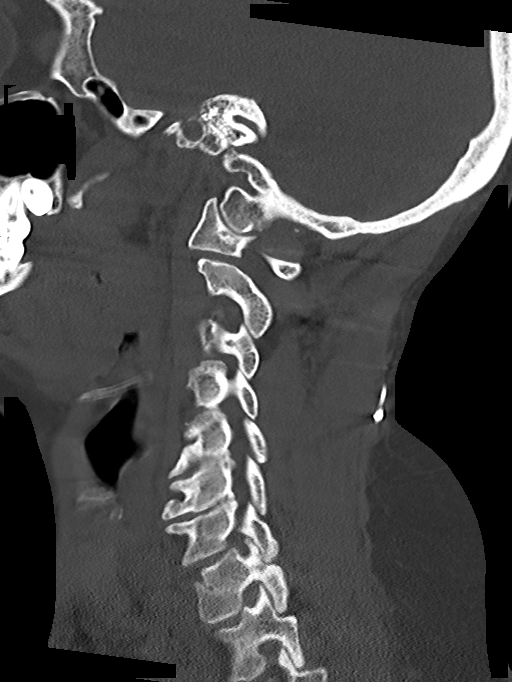
[im 41/61  bone]
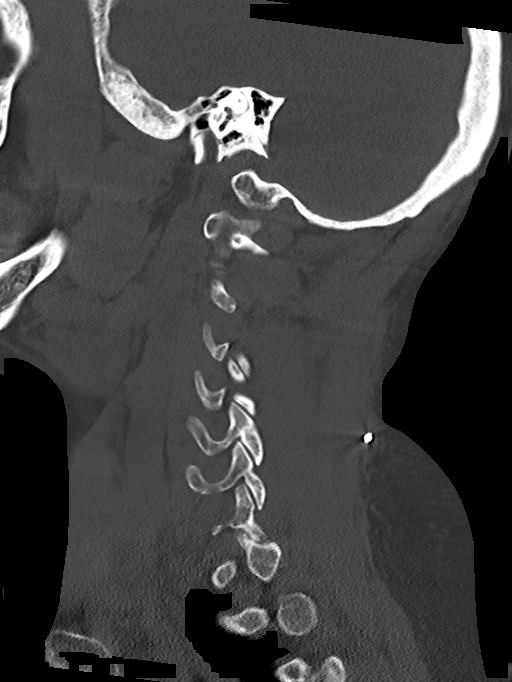

[Series 7: c_spine 2.0 cor bone · coronal · 0.30mm/px · 3 of 61 slices shown]
[im 13/61  bone]
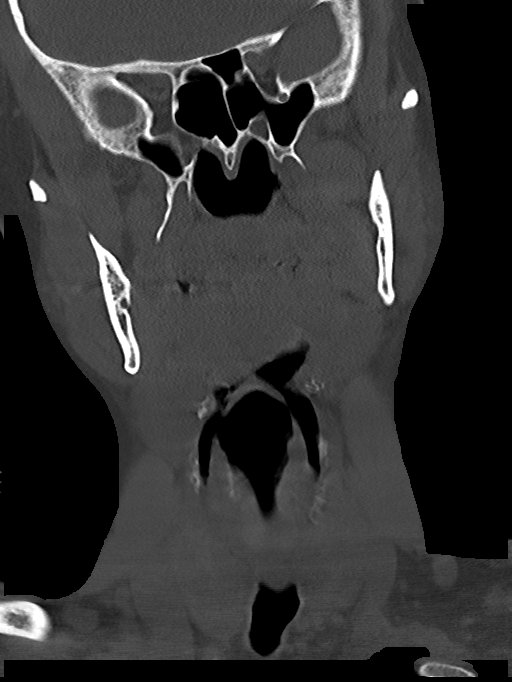
[im 25/61  bone]
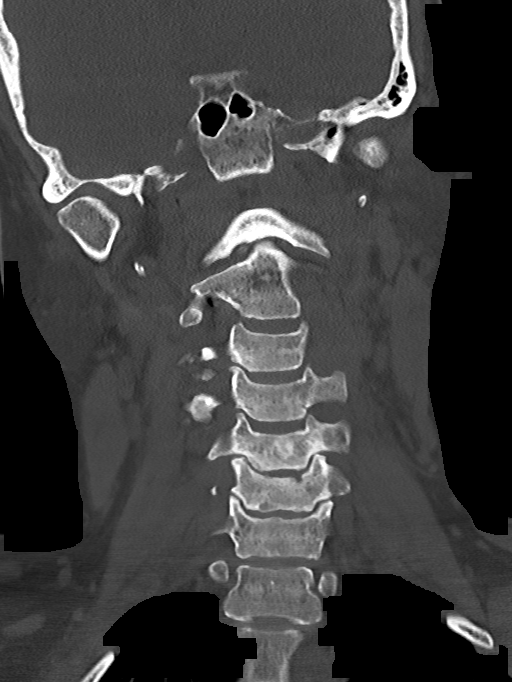
[im 37/61  bone]
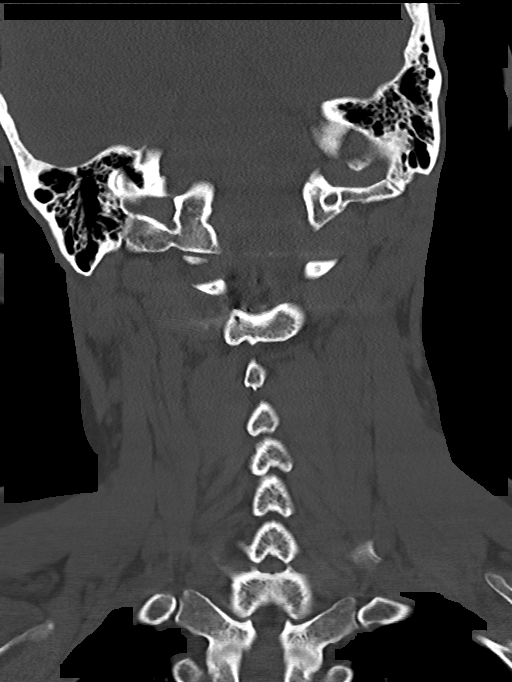

[13 of 33 positions shown; findings below may reference images not displayed]

FINDINGS: CT HEAD FINDINGS

Brain: No subdural, epidural, or subarachnoid hemorrhage. Ventricles
and sulci are stable. Cerebellum, brainstem, and basal cisterns are
stable. No mass effect or midline shift. A lacunar infarct is seen
in the left thalamus, unchanged. No acute cortical ischemia or
infarct. No other acute intracranial abnormalities.

Vascular: No hyperdense vessel or unexpected calcification.

Skull: Normal. Negative for fracture or focal lesion.

Sinuses/Orbits: No acute finding.

Other: 2 apparent skin staples on the left scalp on series 3, image
20. Extracranial soft tissues otherwise normal.

CT CERVICAL SPINE FINDINGS

Alignment: Reversal of normal lordosis centered at C5-6. Minimal
anterolisthesis of C4 versus C5, unchanged since 2427.

Skull base and vertebrae: No acute fracture. No primary bone lesion
or focal pathologic process.

Soft tissues and spinal canal: No prevertebral fluid or swelling. No
visible canal hematoma.

Disc levels: Multilevel degenerative disc disease. Facet
degenerative changes.

Upper chest: Negative.

Other: No other abnormalities.
IMPRESSION: 1. No acute intracranial abnormalities.
2. No fracture or traumatic malalignment in the cervical spine.

## 2023-05-13 ENCOUNTER — Other Ambulatory Visit: Payer: Self-pay

## 2023-05-13 ENCOUNTER — Encounter (HOSPITAL_COMMUNITY): Payer: Self-pay | Admitting: Emergency Medicine

## 2023-05-13 ENCOUNTER — Emergency Department (HOSPITAL_COMMUNITY): Payer: 59

## 2023-05-13 ENCOUNTER — Emergency Department (HOSPITAL_COMMUNITY)
Admission: EM | Admit: 2023-05-13 | Discharge: 2023-05-14 | Disposition: A | Discharge status: Home / Self Care | Payer: 59 | Attending: Emergency Medicine | Admitting: Emergency Medicine

## 2023-05-13 DIAGNOSIS — Z91040 Latex allergy status: Secondary | ICD-10-CM | POA: Diagnosis not present

## 2023-05-13 DIAGNOSIS — M7989 Other specified soft tissue disorders: Secondary | ICD-10-CM | POA: Diagnosis not present

## 2023-05-13 DIAGNOSIS — M67833 Other specified disorders of tendon, right wrist: Secondary | ICD-10-CM | POA: Diagnosis not present

## 2023-05-13 DIAGNOSIS — M25532 Pain in left wrist: Secondary | ICD-10-CM | POA: Diagnosis not present

## 2023-05-13 DIAGNOSIS — Z743 Need for continuous supervision: Secondary | ICD-10-CM | POA: Diagnosis not present

## 2023-05-13 DIAGNOSIS — M778 Other enthesopathies, not elsewhere classified: Secondary | ICD-10-CM | POA: Diagnosis not present

## 2023-05-13 DIAGNOSIS — M79642 Pain in left hand: Secondary | ICD-10-CM | POA: Diagnosis not present

## 2023-05-13 DIAGNOSIS — R6889 Other general symptoms and signs: Secondary | ICD-10-CM | POA: Diagnosis not present

## 2023-05-13 DIAGNOSIS — M779 Enthesopathy, unspecified: Secondary | ICD-10-CM | POA: Diagnosis not present

## 2023-05-13 DIAGNOSIS — M67834 Other specified disorders of tendon, left wrist: Secondary | ICD-10-CM | POA: Insufficient documentation

## 2023-05-13 DIAGNOSIS — M79643 Pain in unspecified hand: Secondary | ICD-10-CM | POA: Diagnosis not present

## 2023-05-13 MED ORDER — DEXTROSE 50 % IV SOLN: 50.0000 mL | Freq: Once | INTRAVENOUS | Status: DC

## 2023-05-13 NOTE — Discharge Instructions (Addendum)
Follow up with your Physician for recheck

## 2023-05-13 NOTE — ED Triage Notes (Signed)
Pt BIB EMS from [facility] with c/o left hand pain x a few days.

## 2023-05-13 NOTE — ED Provider Notes (Signed)
[place] EMERGENCY DEPARTMENT AT [facility] Provider Note   CSN: [id number] Arrival date & time: 05/13/23  2002     History  Chief Complaint  Patient presents with   Hand Pain    Candace Bailey is a 52 y.o. female.  Patient complains of pain in her left wrist.  Patient reports that the pain began 2 days ago.  Patient reports she is concerned because she has pain when she tries to eat.  The history is provided by the patient. No language interpreter was used.  Hand Pain The problem occurs constantly. The problem has not changed since onset.Nothing relieves the symptoms. She has tried nothing for the symptoms.       Home Medications Prior to Admission medications   Medication Sig Start Date End Date Taking? Authorizing Provider  ibuprofen (ADVIL,MOTRIN) 200 MG tablet Take 200 mg by mouth every 6 (six) hours as needed for moderate pain. pain    [provider]  predniSONE (DELTASONE) 20 MG tablet Take 2 tablets daily with breakfast. 02/25/22   Wallis Bamberg, PA-C      Allergies    Latex    Review of Systems   Review of Systems  All other systems reviewed and are negative.   Physical Exam Updated Vital Signs BP (!) 144/97   Pulse 78   Temp 98.4 F (36.9 C)   Resp 18   Ht 4\' 11"  (1.499 m)   Wt 63.5 kg   LMP 04/05/2016   SpO2 100%   BMI 28.27 kg/m  Physical Exam Vitals reviewed.  Constitutional:      Appearance: Normal appearance.  Pulmonary:     Effort: Pulmonary effort is normal.  Musculoskeletal:        General: Swelling and tenderness present.     Comments: Tender left wrist no deformity, neurovascular neurosensory intact  Skin:    General: Skin is warm.  Neurological:     General: No focal deficit present.     Mental Status: She is alert.     ED Results / Procedures / Treatments   Labs (all labs ordered are listed, but only abnormal results are displayed) Labs Reviewed - No data to display  EKG None  Radiology DG Hand  Complete Left  Result Date: 05/13/2023 CLINICAL DATA:  Atraumatic left wrist pain. EXAM: LEFT HAND - COMPLETE 3+ VIEW COMPARISON:  None Available. FINDINGS: There is no evidence of an acute fracture or dislocation. Radiopaque jewelry is seen overlying the proximal phalanx of the fourth left finger. Chronic deformities are suspected involving the PIP joints of the third and fourth left fingers with subsequent chronic flexion of the third and fourth digits. Mild diffuse soft tissue swelling is seen within the region of the left wrist. IMPRESSION: 1. Chronic deformities involving the PIP joints of the third and fourth left fingers. 2. Mild diffuse soft tissue swelling within the region of the left wrist. Electronically Signed   By: Aram Candela M.D.   On: 05/13/2023 22:35   DG Wrist Complete Left  Result Date: 05/13/2023 CLINICAL DATA:  Hand pain EXAM: LEFT WRIST - COMPLETE 3+ VIEW COMPARISON:  None Available. FINDINGS: Lateral view is nondiagnostic secondary to patient positioning. There is no definite evidence of fracture or dislocation. There is no evidence of arthropathy or other focal bone abnormality. There is soft tissue swelling surrounding the wrist. IMPRESSION: Soft tissue swelling surrounding the wrist. No definite evidence of fracture or dislocation. Limited lateral view secondary to patient positioning. Electronically  Signed   By: Darliss Cheney M.D.   On: 05/13/2023 22:35    Procedures Procedures    Medications Ordered in ED Medications - No data to display  ED Course/ Medical Decision Making/ A&P                                 Medical Decision Making Patient complains of pain to her left hand and left wrist.  Amount and/or Complexity of Data Reviewed Radiology: ordered and independent interpretation performed. Decision-making details documented in ED Course.    Details: X-ray left wrist and left hand show fracture  Risk Risk Details: I suspect patient has  tendinitis.           Final Clinical Impression(s) / ED Diagnoses Final diagnoses:  Left wrist tendonitis    Rx / DC Orders ED Discharge Orders     None      An After Visit Summary was printed and given to the patient.    Elson Areas, Cordelia Poche 05/13/23 2334    Derwood Kaplan, MD 05/13/23 [phone]

## 2023-05-14 DIAGNOSIS — M65939 Unspecified synovitis and tenosynovitis, unspecified forearm: Secondary | ICD-10-CM | POA: Diagnosis not present

## 2023-05-14 DIAGNOSIS — Z7401 Bed confinement status: Secondary | ICD-10-CM | POA: Diagnosis not present

## 2023-05-14 DIAGNOSIS — R531 Weakness: Secondary | ICD-10-CM | POA: Diagnosis not present

## 2023-05-14 DIAGNOSIS — M654 Radial styloid tenosynovitis [de Quervain]: Secondary | ICD-10-CM | POA: Diagnosis not present

## 2023-05-14 NOTE — ED Notes (Signed)
[facility] contacted for pt. Transportation back home.

## 2023-05-19 DIAGNOSIS — Z9989 Dependence on other enabling machines and devices: Secondary | ICD-10-CM | POA: Diagnosis not present

## 2023-05-19 DIAGNOSIS — M25532 Pain in left wrist: Secondary | ICD-10-CM | POA: Diagnosis not present

## 2023-05-19 DIAGNOSIS — M1612 Unilateral primary osteoarthritis, left hip: Secondary | ICD-10-CM | POA: Diagnosis not present

## 2023-05-19 DIAGNOSIS — M25372 Other instability, left ankle: Secondary | ICD-10-CM | POA: Diagnosis not present

## 2023-06-24 DIAGNOSIS — Z0000 Encounter for general adult medical examination without abnormal findings: Secondary | ICD-10-CM | POA: Diagnosis not present

## 2023-06-24 DIAGNOSIS — E559 Vitamin D deficiency, unspecified: Secondary | ICD-10-CM | POA: Diagnosis not present

## 2023-06-24 DIAGNOSIS — Z1211 Encounter for screening for malignant neoplasm of colon: Secondary | ICD-10-CM | POA: Diagnosis not present

## 2023-06-24 DIAGNOSIS — Z79899 Other long term (current) drug therapy: Secondary | ICD-10-CM | POA: Diagnosis not present

## 2023-06-24 DIAGNOSIS — W19XXXA Unspecified fall, initial encounter: Secondary | ICD-10-CM | POA: Diagnosis not present

## 2023-06-24 DIAGNOSIS — Z9989 Dependence on other enabling machines and devices: Secondary | ICD-10-CM | POA: Diagnosis not present

## 2023-06-24 DIAGNOSIS — E7800 Pure hypercholesterolemia, unspecified: Secondary | ICD-10-CM | POA: Diagnosis not present

## 2023-07-28 DIAGNOSIS — M24572 Contracture, left ankle: Secondary | ICD-10-CM | POA: Diagnosis not present
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
## 2023-08-20 ENCOUNTER — Other Ambulatory Visit: Payer: Self-pay

## 2023-08-20 ENCOUNTER — Telehealth: Payer: Self-pay | Admitting: Physical Therapy

## 2023-08-20 ENCOUNTER — Encounter: Payer: Self-pay | Admitting: Physical Therapy

## 2023-08-20 ENCOUNTER — Ambulatory Visit: Payer: 59 | Attending: Internal Medicine | Admitting: Physical Therapy

## 2023-08-20 DIAGNOSIS — R2681 Unsteadiness on feet: Secondary | ICD-10-CM | POA: Diagnosis not present

## 2023-08-20 DIAGNOSIS — R2689 Other abnormalities of gait and mobility: Secondary | ICD-10-CM | POA: Diagnosis not present

## 2023-08-20 DIAGNOSIS — M6281 Muscle weakness (generalized): Secondary | ICD-10-CM | POA: Insufficient documentation

## 2023-08-20 DIAGNOSIS — R278 Other lack of coordination: Secondary | ICD-10-CM | POA: Diagnosis not present

## 2023-08-20 DIAGNOSIS — R252 Cramp and spasm: Secondary | ICD-10-CM | POA: Diagnosis not present

## 2023-08-20 NOTE — Telephone Encounter (Signed)
Hi Dr. Orson Aloe,  Candace Bailey was evaluated in OPPT today per your referral. The patient has significant gait deviations that threaten safety and as she uses [facility] to get to/from appointments, she may benefit from [facility] PT and OT therapies. I have spoken to her about this and she stated "that would be better." Would you be able to get her set up for [facility] PT and [facility] OT? Please advise.   Thanks,   Baldemar Friday, PT, DPT 08/20/23 1:50 PM  [facility] at [facility] [address] [place], [address] [place], [place] [address] Phone # [phone] Fax # [phone]
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
## 2023-08-27 ENCOUNTER — Ambulatory Visit: Payer: 59 | Admitting: Physical Therapy

## 2023-08-27 ENCOUNTER — Encounter: Payer: Self-pay | Admitting: Physical Therapy

## 2023-08-27 DIAGNOSIS — R252 Cramp and spasm: Secondary | ICD-10-CM | POA: Diagnosis not present

## 2023-08-27 DIAGNOSIS — R2689 Other abnormalities of gait and mobility: Secondary | ICD-10-CM | POA: Diagnosis not present

## 2023-08-27 DIAGNOSIS — R278 Other lack of coordination: Secondary | ICD-10-CM | POA: Diagnosis not present

## 2023-08-27 DIAGNOSIS — R2681 Unsteadiness on feet: Secondary | ICD-10-CM | POA: Diagnosis not present

## 2023-08-27 DIAGNOSIS — M6281 Muscle weakness (generalized): Secondary | ICD-10-CM

## 2023-09-01 ENCOUNTER — Ambulatory Visit: Payer: 59 | Attending: Internal Medicine

## 2023-09-01 DIAGNOSIS — M6281 Muscle weakness (generalized): Secondary | ICD-10-CM | POA: Insufficient documentation

## 2023-09-01 DIAGNOSIS — R2681 Unsteadiness on feet: Secondary | ICD-10-CM | POA: Diagnosis not present

## 2023-09-01 DIAGNOSIS — R2689 Other abnormalities of gait and mobility: Secondary | ICD-10-CM | POA: Diagnosis not present
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
## 2023-09-03 ENCOUNTER — Ambulatory Visit: Payer: 59

## 2023-09-03 DIAGNOSIS — R2689 Other abnormalities of gait and mobility: Secondary | ICD-10-CM | POA: Diagnosis not present

## 2023-09-03 DIAGNOSIS — R2681 Unsteadiness on feet: Secondary | ICD-10-CM

## 2023-09-03 DIAGNOSIS — M6281 Muscle weakness (generalized): Secondary | ICD-10-CM
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
## 2023-09-08 ENCOUNTER — Encounter: Payer: Self-pay | Admitting: Physical Therapy

## 2023-09-08 ENCOUNTER — Ambulatory Visit: Payer: 59 | Admitting: Physical Therapy

## 2023-09-08 DIAGNOSIS — M6281 Muscle weakness (generalized): Secondary | ICD-10-CM

## 2023-09-08 DIAGNOSIS — R2689 Other abnormalities of gait and mobility: Secondary | ICD-10-CM | POA: Diagnosis not present

## 2023-09-08 DIAGNOSIS — R2681 Unsteadiness on feet: Secondary | ICD-10-CM
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
## 2023-09-10 ENCOUNTER — Ambulatory Visit: Payer: 59

## 2023-09-15 ENCOUNTER — Telehealth: Payer: Self-pay | Admitting: Orthopedic Surgery

## 2023-09-15 ENCOUNTER — Ambulatory Visit: Payer: 59

## 2023-09-15 NOTE — Telephone Encounter (Signed)
 I sw Candace Bailey and she advised that she was having hip pain and wanted an appt with Dr. Magnus Ivan I advised the Candace Bailey that she had an appt sch for Wednesday at 10:45

## 2023-09-15 NOTE — Telephone Encounter (Signed)
 Pt called requesting a call from Autumn F. Please call pt at [phone]

## 2023-09-15 NOTE — Telephone Encounter (Signed)
 I called and left another message for Candace Bailey to call back and that I would put out a message to get me to the phone when she calls so that I can help her.

## 2023-09-15 NOTE — Telephone Encounter (Signed)
 I called pt and lm on vm to advise to cb and let me know what she needs. Whatever I can do to help her I will be happy to do it.

## 2023-09-15 NOTE — Telephone Encounter (Signed)
 Pt returned call to Autumn F. Pt did not say what she needed. Please call pt at [phone]

## 2023-09-17 ENCOUNTER — Other Ambulatory Visit (INDEPENDENT_AMBULATORY_CARE_PROVIDER_SITE_OTHER): Payer: 59

## 2023-09-17 ENCOUNTER — Other Ambulatory Visit: Payer: Self-pay

## 2023-09-17 ENCOUNTER — Ambulatory Visit (INDEPENDENT_AMBULATORY_CARE_PROVIDER_SITE_OTHER): Payer: 59 | Admitting: Orthopaedic Surgery

## 2023-09-17 ENCOUNTER — Ambulatory Visit: Payer: 59 | Admitting: Physical Therapy

## 2023-09-17 VITALS — Ht 59.0 in | Wt 139.0 lb

## 2023-09-17 DIAGNOSIS — M1612 Unilateral primary osteoarthritis, left hip: Secondary | ICD-10-CM

## 2023-09-17 DIAGNOSIS — M25552 Pain in left hip: Secondary | ICD-10-CM

## 2023-09-17 NOTE — Progress Notes (Signed)
 The patient is a 53 year old female that I am seeing for the first time but she is an established patient to practice.  She comes with her mother.  She does ambulate using a stand-up walker.  She has a history of a traumatic brain injury.  She has been dealing with left hip pain for some time now.  She has seen Dr. Shon Baton here in the office and had an intra-articular steroid injection in her left hip sometime ago and that did help some with pain in the joint.  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  Her mobility is quite limited secondary to her left hip pain but also traumatic brain injury.  This is mainly affected her left side in general.  Her mother is with her today who is her advocate as well.  At this point they would like to talk about hip replacement surgery.  I was able to review all of her medications and past medical history within epic.  She is not a diabetic.  She is not obese.  I was able to lay her in a supine position on the exam table so we get a good idea of what her leg lengths are as well as just her left hip and side in general.  She does wear an AFO on her left leg.  Her left side is slightly smaller than the right side and there is some slight leg length discrepancy.  I am able to easily get her hip and knee extended.  She does have severe pain in the groin with internal and external rotation of the left hip.  The right hip moves normally and has normal function.  X-rays today of the pelvis and left hip shows severe end-stage arthritis of the left hip.  This is worsened over the last few years when I looked at old x-rays as well.  The left hip now has bone-on-bone wear and complete loss of joint space with cystic changes as well.  The right hip joint space is well-maintained.  We had a long and thorough discussion about hip replacement surgery.  I went over her x-rays with her and a hip replacement model.  I gave him a handout about hip replacement surgery as well.   We discussed what to expect from an intraoperative and postoperative standpoint and discussed the risks and benefits of the surgery.  At this point her pain is daily with her left hip and is definitely affecting her mobility, her quality of life and her activities of daily living to the point she and her mother do wish for [place] to proceed with a left hip replacement for her.  We will work on getting her on the schedule at the earliest convenience.

## 2023-09-22 ENCOUNTER — Ambulatory Visit: Payer: 59 | Admitting: Physical Therapy

## 2023-09-22 ENCOUNTER — Encounter: Payer: Self-pay | Admitting: Physical Therapy

## 2023-09-22 ENCOUNTER — Telehealth: Payer: Self-pay

## 2023-09-22 DIAGNOSIS — R2681 Unsteadiness on feet: Secondary | ICD-10-CM | POA: Diagnosis not present

## 2023-09-22 DIAGNOSIS — M6281 Muscle weakness (generalized): Secondary | ICD-10-CM

## 2023-09-22 DIAGNOSIS — R2689 Other abnormalities of gait and mobility: Secondary | ICD-10-CM | POA: Diagnosis not present

## 2023-09-22 NOTE — Telephone Encounter (Signed)
 Pt saw CB last week and has decided to have a THR. She was checking the status of getting this sch. Advised that it takes some time to get authorization from the insurance company but [facility] scheduler will call to offer some days and times it could take a few weeks but she can call to check the status any time. Pt voiced understanding and will call with any other questions.
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
## 2023-09-24 ENCOUNTER — Ambulatory Visit: Payer: 59 | Admitting: Physical Therapy

## 2023-09-24 DIAGNOSIS — M6281 Muscle weakness (generalized): Secondary | ICD-10-CM | POA: Diagnosis not present

## 2023-09-24 DIAGNOSIS — R2681 Unsteadiness on feet: Secondary | ICD-10-CM | POA: Diagnosis not present

## 2023-09-24 DIAGNOSIS — R2689 Other abnormalities of gait and mobility: Secondary | ICD-10-CM
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
## 2023-09-26 ENCOUNTER — Telehealth: Payer: Self-pay

## 2023-09-26 NOTE — Telephone Encounter (Signed)
 I called Mom to discuss surgery date.  Left voice mail message for return call.

## 2023-09-29 ENCOUNTER — Encounter: Payer: Self-pay | Admitting: Physical Therapy

## 2023-09-29 ENCOUNTER — Ambulatory Visit: Payer: 59 | Attending: Internal Medicine | Admitting: Physical Therapy

## 2023-09-29 DIAGNOSIS — R252 Cramp and spasm: Secondary | ICD-10-CM | POA: Diagnosis not present

## 2023-09-29 DIAGNOSIS — R278 Other lack of coordination: Secondary | ICD-10-CM | POA: Insufficient documentation

## 2023-09-29 DIAGNOSIS — R2681 Unsteadiness on feet: Secondary | ICD-10-CM | POA: Diagnosis not present

## 2023-09-29 DIAGNOSIS — M6281 Muscle weakness (generalized): Secondary | ICD-10-CM | POA: Diagnosis not present

## 2023-09-29 DIAGNOSIS — R2689 Other abnormalities of gait and mobility: Secondary | ICD-10-CM | POA: Insufficient documentation
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
## 2023-09-30 ENCOUNTER — Other Ambulatory Visit: Payer: Self-pay

## 2023-09-30 ENCOUNTER — Telehealth: Payer: Self-pay

## 2023-09-30 NOTE — Telephone Encounter (Signed)
 I called patient and confirmed surgery date of 10/23/23.
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
## 2023-09-30 NOTE — Telephone Encounter (Signed)
 The pt called to check the status of her hip surgery. She is very nervous about this and asked if you could call her cell number to get in touch with her.

## 2023-10-01 ENCOUNTER — Encounter: Payer: Self-pay | Admitting: Physical Therapy

## 2023-10-01 ENCOUNTER — Ambulatory Visit: Payer: 59 | Admitting: Physical Therapy

## 2023-10-01 DIAGNOSIS — R2689 Other abnormalities of gait and mobility: Secondary | ICD-10-CM | POA: Diagnosis not present

## 2023-10-01 DIAGNOSIS — R252 Cramp and spasm: Secondary | ICD-10-CM | POA: Diagnosis not present

## 2023-10-01 DIAGNOSIS — M6281 Muscle weakness (generalized): Secondary | ICD-10-CM | POA: Diagnosis not present

## 2023-10-01 DIAGNOSIS — R2681 Unsteadiness on feet: Secondary | ICD-10-CM | POA: Diagnosis not present

## 2023-10-01 DIAGNOSIS — R278 Other lack of coordination: Secondary | ICD-10-CM | POA: Diagnosis not present
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
## 2023-10-02 ENCOUNTER — Telehealth: Payer: Self-pay
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
## 2023-10-02 NOTE — Telephone Encounter (Signed)
 Already scheduled for 10/23/23.

## 2023-10-02 NOTE — Telephone Encounter (Signed)
 Mom is asking what you think outcome of surgery will be since patient doesn't have normal gait to begin with.

## 2023-10-06 ENCOUNTER — Ambulatory Visit: Admitting: Physical Therapy

## 2023-10-06 ENCOUNTER — Encounter: Payer: Self-pay | Admitting: Physical Therapy

## 2023-10-06 DIAGNOSIS — R2681 Unsteadiness on feet: Secondary | ICD-10-CM | POA: Diagnosis not present

## 2023-10-06 DIAGNOSIS — R278 Other lack of coordination: Secondary | ICD-10-CM | POA: Diagnosis not present

## 2023-10-06 DIAGNOSIS — R252 Cramp and spasm: Secondary | ICD-10-CM | POA: Diagnosis not present

## 2023-10-06 DIAGNOSIS — R2689 Other abnormalities of gait and mobility: Secondary | ICD-10-CM

## 2023-10-06 DIAGNOSIS — M6281 Muscle weakness (generalized): Secondary | ICD-10-CM | POA: Diagnosis not present
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
## 2023-10-08 ENCOUNTER — Encounter: Payer: Self-pay | Admitting: Physical Therapy

## 2023-10-08 ENCOUNTER — Ambulatory Visit: Admitting: Physical Therapy

## 2023-10-08 DIAGNOSIS — R252 Cramp and spasm: Secondary | ICD-10-CM | POA: Diagnosis not present

## 2023-10-08 DIAGNOSIS — M6281 Muscle weakness (generalized): Secondary | ICD-10-CM

## 2023-10-08 DIAGNOSIS — R2689 Other abnormalities of gait and mobility: Secondary | ICD-10-CM | POA: Diagnosis not present

## 2023-10-08 DIAGNOSIS — R278 Other lack of coordination: Secondary | ICD-10-CM | POA: Diagnosis not present

## 2023-10-08 DIAGNOSIS — R2681 Unsteadiness on feet: Secondary | ICD-10-CM

## 2023-10-13 ENCOUNTER — Encounter: Payer: Self-pay | Admitting: Physical Therapy

## 2023-10-13 ENCOUNTER — Ambulatory Visit: Admitting: Physical Therapy

## 2023-10-13 DIAGNOSIS — M6281 Muscle weakness (generalized): Secondary | ICD-10-CM | POA: Diagnosis not present

## 2023-10-13 DIAGNOSIS — R278 Other lack of coordination: Secondary | ICD-10-CM | POA: Diagnosis not present

## 2023-10-13 DIAGNOSIS — R252 Cramp and spasm: Secondary | ICD-10-CM | POA: Diagnosis not present

## 2023-10-13 DIAGNOSIS — R2681 Unsteadiness on feet: Secondary | ICD-10-CM

## 2023-10-13 DIAGNOSIS — R2689 Other abnormalities of gait and mobility: Secondary | ICD-10-CM | POA: Diagnosis not present

## 2023-10-13 NOTE — Patient Instructions (Signed)
 SURGICAL WAITING ROOM VISITATION  Patients having surgery or a procedure may have no more than 2 support people in the waiting area - these visitors may rotate.    Children under the age of 21 must have an adult with them who is not the patient.  Due to an increase in RSV and influenza rates and associated hospitalizations, children ages 63 and under may not visit patients in [facility] hospitals.  Visitors with respiratory illnesses are discouraged from visiting and should remain at home.  If the patient needs to stay at the hospital during part of their recovery, the visitor guidelines for inpatient rooms apply. Pre-op nurse will coordinate an appropriate time for 1 support person to accompany patient in pre-op.  This support person may not rotate.    Please refer to the [facility] website for the visitor guidelines for Inpatients (after your surgery is over and you are in a regular room).       Your procedure is scheduled on:  10/23/23   Report to [facility] Main Entrance    Report to admitting at   [phone]   Call this number if you have problems the morning of surgery [phone]   Do not eat food :After Midnight.   After Midnight you may have the following liquids until _ 0600_____ AM  DAY OF SURGERY  Water Non-Citrus Juices (without pulp, NO RED-Apple, White grape, White cranberry) Black Coffee (NO MILK/CREAM OR CREAMERS, sugar ok)  Clear Tea (NO MILK/CREAM OR CREAMERS, sugar ok) regular and decaf                             Plain Jell-O (NO RED)                                           Fruit ices (not with fruit pulp, NO RED)                                     Popsicles (NO RED)                                                               Sports drinks like Gatorade (NO RED)                    The day of surgery:  Drink ONE (1) Pre-Surgery Clear Ensure or G2 at  0600 AM ( have completed by ) the morning of surgery. Drink in one sitting. Do not sip.   This drink was given to you during your hospital  pre-op appointment visit. Nothing else to drink after completing the  Pre-Surgery Clear Ensure or G2.          If you have questions, please contact your surgeon's office.        Oral Hygiene is also important to reduce your risk of infection.  Remember - BRUSH YOUR TEETH THE MORNING OF SURGERY WITH YOUR REGULAR TOOTHPASTE  DENTURES WILL BE REMOVED PRIOR TO SURGERY PLEASE DO NOT APPLY "Poly grip" OR ADHESIVES!!!   Do NOT smoke after Midnight   Stop all vitamins and herbal supplements 7 days before surgery.   Take these medicines the morning of surgery with A SIP OF WATER:  none   DO NOT TAKE ANY ORAL DIABETIC MEDICATIONS DAY OF YOUR SURGERY  Bring CPAP mask and tubing day of surgery.                              You may not have any metal on your body including hair pins, jewelry, and body piercing             Do not wear make-up, lotions, powders, perfumes/cologne, or deodorant  Do not wear nail polish including gel and S&S, artificial/acrylic nails, or any other type of covering on natural nails including finger and toenails. If you have artificial nails, gel coating, etc. that needs to be removed by a nail salon please have this removed prior to surgery or surgery may need to be canceled/ delayed if the surgeon/ anesthesia feels like they are unable to be safely monitored.   Do not shave  48 hours prior to surgery.               Men may shave face and neck.   Do not bring valuables to the hospital. [facility] IS NOT             RESPONSIBLE   FOR VALUABLES.   Contacts, glasses, dentures or bridgework may not be worn into surgery.   Bring small overnight bag day of surgery.   DO NOT BRING YOUR HOME MEDICATIONS TO THE HOSPITAL. PHARMACY WILL DISPENSE MEDICATIONS LISTED ON YOUR MEDICATION LIST TO YOU DURING YOUR ADMISSION IN THE HOSPITAL!    Patients discharged on the day of surgery  will not be allowed to drive home.  Someone NEEDS to stay with you for the first 24 hours after anesthesia.   Special Instructions: Bring a copy of your healthcare power of attorney and living will documents the day of surgery if you haven't scanned them before.              Please read over the following fact sheets you were given: IF YOU HAVE QUESTIONS ABOUT YOUR PRE-OP INSTRUCTIONS PLEASE CALL [phone]   If you received a COVID test during your pre-op visit  it is requested that you wear a mask when out in public, stay away from anyone that may not be feeling well and notify your surgeon if you develop symptoms. If you test positive for Covid or have been in contact with anyone that has tested positive in the last 10 days please notify you surgeon.      Pre-operative 5 CHG Bath Instructions   You can play a key role in reducing the risk of infection after surgery. Your skin needs to be as free of germs as possible. You can reduce the number of germs on your skin by washing with CHG (chlorhexidine gluconate) soap before surgery. CHG is an antiseptic soap that kills germs and continues to kill germs even after washing.   DO NOT use if you have an allergy to chlorhexidine/CHG or antibacterial soaps. If your skin becomes reddened or irritated, stop using the CHG and notify one of our RNs at  [phone].   Please shower with the CHG soap starting 4 days before surgery using the following schedule:     Please keep in mind the following:  DO NOT shave, including legs and underarms, starting the day of your first shower.   You may shave your face at any point before/day of surgery.  Place clean sheets on your bed the day you start using CHG soap. Use a clean washcloth (not used since being washed) for each shower. DO NOT sleep with pets once you start using the CHG.   CHG Shower Instructions:  If you choose to wash your hair and private area, wash first with your normal shampoo/soap.   After you use shampoo/soap, rinse your hair and body thoroughly to remove shampoo/soap residue.  Turn the water OFF and apply about 3 tablespoons (45 ml) of CHG soap to a CLEAN washcloth.  Apply CHG soap ONLY FROM YOUR NECK DOWN TO YOUR TOES (washing for 3-5 minutes)  DO NOT use CHG soap on face, private areas, open wounds, or sores.  Pay special attention to the area where your surgery is being performed.  If you are having back surgery, having someone wash your back for you may be helpful. Wait 2 minutes after CHG soap is applied, then you may rinse off the CHG soap.  Pat dry with a clean towel  Put on clean clothes/pajamas   If you choose to wear lotion, please use ONLY the CHG-compatible lotions on the back of this paper.     Additional instructions for the day of surgery: DO NOT APPLY any lotions, deodorants, cologne, or perfumes.   Put on clean/comfortable clothes.  Brush your teeth.  Ask your nurse before applying any prescription medications to the skin.      CHG Compatible Lotions   Aveeno Moisturizing lotion  Cetaphil Moisturizing Cream  Cetaphil Moisturizing Lotion  Clairol Herbal Essence Moisturizing Lotion, Dry Skin  Clairol Herbal Essence Moisturizing Lotion, Extra Dry Skin  Clairol Herbal Essence Moisturizing Lotion, Normal Skin  Curel Age Defying Therapeutic Moisturizing Lotion with Alpha Hydroxy  Curel Extreme Care Body Lotion  Curel Soothing Hands Moisturizing Hand Lotion  Curel Therapeutic Moisturizing Cream, Fragrance-Free  Curel Therapeutic Moisturizing Lotion, Fragrance-Free  Curel Therapeutic Moisturizing Lotion, Original Formula  Eucerin Daily Replenishing Lotion  Eucerin Dry Skin Therapy Plus Alpha Hydroxy Crme  Eucerin Dry Skin Therapy Plus Alpha Hydroxy Lotion  Eucerin Original Crme  Eucerin Original Lotion  Eucerin Plus Crme Eucerin Plus Lotion  Eucerin TriLipid Replenishing Lotion  Keri Anti-Bacterial Hand Lotion  Keri Deep Conditioning  Original Lotion Dry Skin Formula Softly Scented  Keri Deep Conditioning Original Lotion, Fragrance Free Sensitive Skin Formula  Keri Lotion Fast Absorbing Fragrance Free Sensitive Skin Formula  Keri Lotion Fast Absorbing Softly Scented Dry Skin Formula  Keri Original Lotion  Keri Skin Renewal Lotion Keri Silky Smooth Lotion  Keri Silky Smooth Sensitive Skin Lotion  Nivea Body Creamy Conditioning Oil  Nivea Body Extra Enriched Teacher, adult education Moisturizing Lotion Nivea Crme  Nivea Skin Firming Lotion  NutraDerm 30 Skin Lotion  NutraDerm Skin Lotion  NutraDerm Therapeutic Skin Cream  NutraDerm Therapeutic Skin Lotion  ProShield Protective Hand Cream  Provon moisturizing lotion

## 2023-10-13 NOTE — Progress Notes (Signed)
 Anesthesia Review:  PCP: Cardiologist :  PPM/ ICD: Device Orders: Rep Notified:  Chest x-ray : EKG : Echo : Stress test: Cardiac Cath :   Activity level:  Sleep Study/ CPAP : Fasting Blood Sugar :      / Checks Blood Sugar -- times a day:    Blood Thinner/ Instructions /Last Dose: ASA / Instructions/ Last Dose :    TBI- age 53   PT note on 10/08/23  Walker Cognitive Impairment
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
## 2023-10-14 ENCOUNTER — Encounter (HOSPITAL_COMMUNITY): Admission: RE | Admit: 2023-10-14 | Source: Ambulatory Visit

## 2023-10-14 ENCOUNTER — Other Ambulatory Visit: Payer: Self-pay

## 2023-10-14 ENCOUNTER — Encounter (HOSPITAL_COMMUNITY)
Admission: RE | Admit: 2023-10-14 | Discharge: 2023-10-14 | Disposition: A | Discharge status: Home / Self Care | Source: Ambulatory Visit | Attending: Orthopaedic Surgery | Admitting: Orthopaedic Surgery

## 2023-10-14 ENCOUNTER — Encounter (HOSPITAL_COMMUNITY): Payer: Self-pay

## 2023-10-14 VITALS — BP 126/83 | HR 65 | Temp 98.3°F | Resp 16 | Ht 59.0 in

## 2023-10-14 DIAGNOSIS — M1612 Unilateral primary osteoarthritis, left hip: Secondary | ICD-10-CM | POA: Diagnosis not present

## 2023-10-14 DIAGNOSIS — Z01818 Encounter for other preprocedural examination: Secondary | ICD-10-CM | POA: Diagnosis not present

## 2023-10-14 HISTORY — DX: Hemiplegia, unspecified affecting unspecified side: G81.90

## 2023-10-14 HISTORY — DX: Nontraumatic subarachnoid hemorrhage, unspecified: I60.9

## 2023-10-14 HISTORY — DX: Unspecified osteoarthritis, unspecified site: M19.90

## 2023-10-14 LAB — CBC
HCT: 36.9 % (ref 36.0–46.0)
Hemoglobin: 12.2 g/dL (ref 12.0–15.0)
MCH: 29.5 pg (ref 26.0–34.0)
MCHC: 33.1 g/dL (ref 30.0–36.0)
MCV: 89.1 fL (ref 80.0–100.0)
Platelets: 257 10*3/uL (ref 150–400)
RBC: 4.14 MIL/uL (ref 3.87–5.11)
RDW: 12.6 % (ref 11.5–15.5)
WBC: 5.7 10*3/uL (ref 4.0–10.5)
nRBC: 0 % (ref 0.0–0.2)

## 2023-10-14 LAB — BASIC METABOLIC PANEL
Anion gap: 8 (ref 5–15)
BUN: 18 mg/dL (ref 6–20)
CO2: 26 mmol/L (ref 22–32)
Calcium: 9.2 mg/dL (ref 8.9–10.3)
Chloride: 104 mmol/L (ref 98–111)
Creatinine, Ser: 0.65 mg/dL (ref 0.44–1.00)
GFR, Estimated: >60 mL/min (ref >60)
Glucose, Bld: 84 mg/dL (ref 70–99)
Potassium: 4.6 mmol/L (ref 3.5–5.1)
Sodium: 138 mmol/L (ref 135–145)

## 2023-10-14 LAB — SURGICAL PCR SCREEN
MRSA, PCR: NEGATIVE
Staphylococcus aureus: NEGATIVE
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
## 2023-10-15 ENCOUNTER — Encounter: Payer: Self-pay | Admitting: Physical Therapy

## 2023-10-15 ENCOUNTER — Encounter (HOSPITAL_COMMUNITY): Payer: Self-pay

## 2023-10-15 ENCOUNTER — Ambulatory Visit: Admitting: Physical Therapy

## 2023-10-15 DIAGNOSIS — R2681 Unsteadiness on feet: Secondary | ICD-10-CM

## 2023-10-15 DIAGNOSIS — M6281 Muscle weakness (generalized): Secondary | ICD-10-CM | POA: Diagnosis not present

## 2023-10-15 DIAGNOSIS — R252 Cramp and spasm: Secondary | ICD-10-CM | POA: Diagnosis not present

## 2023-10-15 DIAGNOSIS — R2689 Other abnormalities of gait and mobility: Secondary | ICD-10-CM | POA: Diagnosis not present

## 2023-10-15 DIAGNOSIS — R278 Other lack of coordination: Secondary | ICD-10-CM | POA: Diagnosis not present

## 2023-10-17 ENCOUNTER — Encounter (HOSPITAL_COMMUNITY)
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
## 2023-10-23 ENCOUNTER — Observation Stay (HOSPITAL_COMMUNITY)

## 2023-10-23 ENCOUNTER — Other Ambulatory Visit: Payer: Self-pay

## 2023-10-23 ENCOUNTER — Ambulatory Visit (HOSPITAL_COMMUNITY)

## 2023-10-23 ENCOUNTER — Inpatient Hospital Stay (HOSPITAL_COMMUNITY)
Admission: RE | Admit: 2023-10-23 | Discharge: 2023-10-30 | DRG: 470 | Disposition: A | Discharge status: Skilled Nursing Facility | Attending: Orthopaedic Surgery | Admitting: Orthopaedic Surgery

## 2023-10-23 ENCOUNTER — Ambulatory Visit (HOSPITAL_COMMUNITY): Admitting: Anesthesiology

## 2023-10-23 ENCOUNTER — Encounter (HOSPITAL_COMMUNITY)
Admission: RE | Disposition: A | Discharge status: Skilled Nursing Facility | Payer: Self-pay | Source: Home / Self Care | Attending: Orthopaedic Surgery

## 2023-10-23 ENCOUNTER — Encounter (HOSPITAL_COMMUNITY): Payer: Self-pay | Admitting: Orthopaedic Surgery

## 2023-10-23 ENCOUNTER — Ambulatory Visit (HOSPITAL_COMMUNITY): Payer: Self-pay | Admitting: Physician Assistant

## 2023-10-23 DIAGNOSIS — M2170 Unequal limb length (acquired), unspecified site: Secondary | ICD-10-CM | POA: Diagnosis not present

## 2023-10-23 DIAGNOSIS — M1612 Unilateral primary osteoarthritis, left hip: Secondary | ICD-10-CM | POA: Diagnosis not present

## 2023-10-23 DIAGNOSIS — Z8619 Personal history of other infectious and parasitic diseases: Secondary | ICD-10-CM

## 2023-10-23 DIAGNOSIS — Z96642 Presence of left artificial hip joint: Secondary | ICD-10-CM | POA: Diagnosis not present

## 2023-10-23 DIAGNOSIS — M25752 Osteophyte, left hip: Secondary | ICD-10-CM | POA: Diagnosis not present

## 2023-10-23 DIAGNOSIS — Z9889 Other specified postprocedural states: Secondary | ICD-10-CM

## 2023-10-23 DIAGNOSIS — Z9181 History of falling: Secondary | ICD-10-CM | POA: Diagnosis not present

## 2023-10-23 DIAGNOSIS — Z91040 Latex allergy status: Secondary | ICD-10-CM

## 2023-10-23 DIAGNOSIS — I69851 Hemiplegia and hemiparesis following other cerebrovascular disease affecting right dominant side: Secondary | ICD-10-CM

## 2023-10-23 DIAGNOSIS — I69854 Hemiplegia and hemiparesis following other cerebrovascular disease affecting left non-dominant side: Secondary | ICD-10-CM | POA: Diagnosis not present

## 2023-10-23 DIAGNOSIS — Z7982 Long term (current) use of aspirin: Secondary | ICD-10-CM | POA: Diagnosis not present

## 2023-10-23 DIAGNOSIS — Z471 Aftercare following joint replacement surgery: Secondary | ICD-10-CM | POA: Diagnosis not present

## 2023-10-23 DIAGNOSIS — I69828 Other speech and language deficits following other cerebrovascular disease: Secondary | ICD-10-CM | POA: Diagnosis not present

## 2023-10-23 DIAGNOSIS — M85652 Other cyst of bone, left thigh: Secondary | ICD-10-CM | POA: Diagnosis not present

## 2023-10-23 DIAGNOSIS — Z01818 Encounter for other preprocedural examination: Secondary | ICD-10-CM

## 2023-10-23 HISTORY — PX: TOTAL HIP ARTHROPLASTY: SHX124

## 2023-10-23 LAB — TYPE AND SCREEN
ABO/RH(D): O POS
Antibody Screen: NEGATIVE

## 2023-10-23 LAB — ABO/RH: ABO/RH(D): O POS

## 2023-10-23 SURGERY — ARTHROPLASTY, HIP, TOTAL, ANTERIOR APPROACH
Anesthesia: General | Site: Hip | Laterality: Left

## 2023-10-23 MED ORDER — ALUM & MAG HYDROXIDE-SIMETH 200-200-20 MG/5ML PO SUSP: 30.0000 mL | ORAL | Status: DC | PRN

## 2023-10-23 MED ORDER — ASPIRIN 81 MG PO CHEW
81.0000 mg | CHEWABLE_TABLET | Freq: Two times a day (BID) | ORAL | Status: DC
  Administered 2023-10-23 – 2023-10-30 (×14): 81 mg via ORAL
  Filled 2023-10-23 (×14): qty 1

## 2023-10-23 MED ORDER — DEXAMETHASONE SODIUM PHOSPHATE 10 MG/ML IJ SOLN
INTRAMUSCULAR | Status: DC | PRN
  Administered 2023-10-23: 10 mg via INTRAVENOUS

## 2023-10-23 MED ORDER — ACETAMINOPHEN 325 MG PO TABS
325.0000 mg | ORAL_TABLET | Freq: Four times a day (QID) | ORAL | Status: DC | PRN
  Administered 2023-10-24 – 2023-10-30 (×7): 650 mg via ORAL
  Filled 2023-10-23 (×9): qty 2

## 2023-10-23 MED ORDER — LIDOCAINE 2% (20 MG/ML) 5 ML SYRINGE
INTRAMUSCULAR | Status: DC | PRN
  Administered 2023-10-23: 60 mg via INTRAVENOUS

## 2023-10-23 MED ORDER — METOCLOPRAMIDE HCL 5 MG PO TABS: 5.0000 mg | ORAL_TABLET | Freq: Three times a day (TID) | ORAL | Status: DC | PRN

## 2023-10-23 MED ORDER — METHOCARBAMOL 500 MG PO TABS
500.0000 mg | ORAL_TABLET | Freq: Four times a day (QID) | ORAL | Status: DC | PRN
  Administered 2023-10-24: 500 mg via ORAL
  Filled 2023-10-23: qty 1

## 2023-10-23 MED ORDER — 0.9 % SODIUM CHLORIDE (POUR BTL) OPTIME
TOPICAL | Status: DC | PRN
  Administered 2023-10-23: 1000 mL

## 2023-10-23 MED ORDER — HYDROMORPHONE HCL 1 MG/ML IJ SOLN
0.2500 mg | INTRAMUSCULAR | Status: DC | PRN
  Administered 2023-10-23: 0.25 mg via INTRAVENOUS

## 2023-10-23 MED ORDER — METHOCARBAMOL 1000 MG/10ML IJ SOLN: 500.0000 mg | Freq: Four times a day (QID) | INTRAMUSCULAR | Status: DC | PRN

## 2023-10-23 MED ORDER — LACTATED RINGERS IV SOLN: INTRAVENOUS | Status: DC

## 2023-10-23 MED ORDER — MIDAZOLAM HCL 2 MG/2ML IJ SOLN
INTRAMUSCULAR | Status: AC
  Filled 2023-10-23: qty 2

## 2023-10-23 MED ORDER — BUPIVACAINE-EPINEPHRINE (PF) 0.25% -1:200000 IJ SOLN
INTRAMUSCULAR | Status: AC
  Filled 2023-10-23: qty 30

## 2023-10-23 MED ORDER — PROPOFOL 10 MG/ML IV BOLUS
INTRAVENOUS | Status: AC
  Filled 2023-10-23: qty 20

## 2023-10-23 MED ORDER — CEFAZOLIN SODIUM-DEXTROSE 2-4 GM/100ML-% IV SOLN
2.0000 g | Freq: Four times a day (QID) | INTRAVENOUS | Status: AC
  Administered 2023-10-23 (×2): 2 g via INTRAVENOUS
  Filled 2023-10-23 (×2): qty 100

## 2023-10-23 MED ORDER — DROPERIDOL 2.5 MG/ML IJ SOLN: 0.6250 mg | Freq: Once | INTRAMUSCULAR | Status: DC | PRN

## 2023-10-23 MED ORDER — PROPOFOL 500 MG/50ML IV EMUL
INTRAVENOUS | Status: DC | PRN
  Administered 2023-10-23: 150 ug/kg/min via INTRAVENOUS

## 2023-10-23 MED ORDER — ONDANSETRON HCL 4 MG/2ML IJ SOLN: 4.0000 mg | Freq: Four times a day (QID) | INTRAMUSCULAR | Status: DC | PRN

## 2023-10-23 MED ORDER — POVIDONE-IODINE 10 % EX SWAB
2.0000 | Freq: Once | CUTANEOUS | Status: AC
  Administered 2023-10-23: 2 via TOPICAL

## 2023-10-23 MED ORDER — PROPOFOL 1000 MG/100ML IV EMUL
INTRAVENOUS | Status: AC
  Filled 2023-10-23: qty 100

## 2023-10-23 MED ORDER — SUGAMMADEX SODIUM 200 MG/2ML IV SOLN
INTRAVENOUS | Status: DC | PRN
Start: 2023-10-23 — End: 2023-10-23
  Administered 2023-10-23: 120 mg via INTRAVENOUS

## 2023-10-23 MED ORDER — ACETAMINOPHEN 500 MG PO TABS
1000.0000 mg | ORAL_TABLET | Freq: Once | ORAL | Status: AC
  Administered 2023-10-23: 1000 mg via ORAL
  Filled 2023-10-23: qty 2

## 2023-10-23 MED ORDER — METOCLOPRAMIDE HCL 5 MG/ML IJ SOLN: 5.0000 mg | Freq: Three times a day (TID) | INTRAMUSCULAR | Status: DC | PRN

## 2023-10-23 MED ORDER — MEPERIDINE HCL 50 MG/ML IJ SOLN: 6.2500 mg | INTRAMUSCULAR | Status: DC | PRN

## 2023-10-23 MED ORDER — MIDAZOLAM HCL 2 MG/2ML IJ SOLN
INTRAMUSCULAR | Status: DC | PRN
  Administered 2023-10-23: 2 mg via INTRAVENOUS

## 2023-10-23 MED ORDER — PHENYLEPHRINE 80 MCG/ML (10ML) SYRINGE FOR IV PUSH (FOR BLOOD PRESSURE SUPPORT)
PREFILLED_SYRINGE | INTRAVENOUS | Status: DC | PRN
  Administered 2023-10-23: 80 ug via INTRAVENOUS

## 2023-10-23 MED ORDER — PHENOL 1.4 % MT LIQD: 1.0000 | OROMUCOSAL | Status: DC | PRN

## 2023-10-23 MED ORDER — CHLORHEXIDINE GLUCONATE 0.12 % MT SOLN
15.0000 mL | Freq: Once | OROMUCOSAL | Status: AC
  Administered 2023-10-23: 15 mL via OROMUCOSAL

## 2023-10-23 MED ORDER — CEFAZOLIN SODIUM-DEXTROSE 2-4 GM/100ML-% IV SOLN
2.0000 g | INTRAVENOUS | Status: AC
  Administered 2023-10-23: 2 g via INTRAVENOUS
  Filled 2023-10-23: qty 100

## 2023-10-23 MED ORDER — ONDANSETRON HCL 4 MG/2ML IJ SOLN
INTRAMUSCULAR | Status: DC | PRN
  Administered 2023-10-23: 4 mg via INTRAVENOUS

## 2023-10-23 MED ORDER — FENTANYL CITRATE (PF) 250 MCG/5ML IJ SOLN
INTRAMUSCULAR | Status: DC | PRN
  Administered 2023-10-23 (×2): 50 ug via INTRAVENOUS

## 2023-10-23 MED ORDER — PROPOFOL 10 MG/ML IV BOLUS
INTRAVENOUS | Status: DC | PRN
  Administered 2023-10-23: 100 mg via INTRAVENOUS

## 2023-10-23 MED ORDER — PANTOPRAZOLE SODIUM 40 MG PO TBEC
40.0000 mg | DELAYED_RELEASE_TABLET | Freq: Every day | ORAL | Status: DC
  Administered 2023-10-24 – 2023-10-30 (×7): 40 mg via ORAL
  Filled 2023-10-23 (×7): qty 1

## 2023-10-23 MED ORDER — SODIUM CHLORIDE 0.9 % IV SOLN: INTRAVENOUS | Status: DC

## 2023-10-23 MED ORDER — SODIUM CHLORIDE 0.9 % IR SOLN
Status: DC | PRN
  Administered 2023-10-23: 1000 mL

## 2023-10-23 MED ORDER — DIPHENHYDRAMINE HCL 12.5 MG/5ML PO ELIX: 12.5000 mg | ORAL_SOLUTION | ORAL | Status: DC | PRN

## 2023-10-23 MED ORDER — BUPIVACAINE-EPINEPHRINE (PF) 0.25% -1:200000 IJ SOLN
INTRAMUSCULAR | Status: DC | PRN
  Administered 2023-10-23: 30 mL via PERINEURAL

## 2023-10-23 MED ORDER — TRANEXAMIC ACID-NACL 1000-0.7 MG/100ML-% IV SOLN
1000.0000 mg | INTRAVENOUS | Status: AC
  Administered 2023-10-23: 1000 mg via INTRAVENOUS
  Filled 2023-10-23: qty 100

## 2023-10-23 MED ORDER — HYDROMORPHONE HCL 1 MG/ML IJ SOLN: 0.5000 mg | INTRAMUSCULAR | Status: DC | PRN

## 2023-10-23 MED ORDER — OXYCODONE HCL 5 MG PO TABS: 10.0000 mg | ORAL_TABLET | ORAL | Status: DC | PRN

## 2023-10-23 MED ORDER — MENTHOL 3 MG MT LOZG: 1.0000 | LOZENGE | OROMUCOSAL | Status: DC | PRN

## 2023-10-23 MED ORDER — FENTANYL CITRATE (PF) 100 MCG/2ML IJ SOLN
INTRAMUSCULAR | Status: AC
  Filled 2023-10-23: qty 2

## 2023-10-23 MED ORDER — ACETAMINOPHEN 10 MG/ML IV SOLN
INTRAVENOUS | Status: AC
  Filled 2023-10-23: qty 100

## 2023-10-23 MED ORDER — HYDROMORPHONE HCL 1 MG/ML IJ SOLN
INTRAMUSCULAR | Status: AC
  Filled 2023-10-23: qty 1

## 2023-10-23 MED ORDER — OXYCODONE HCL 5 MG PO TABS
5.0000 mg | ORAL_TABLET | ORAL | Status: DC | PRN
  Administered 2023-10-24 – 2023-10-26 (×3): 5 mg via ORAL
  Filled 2023-10-23 (×3): qty 1

## 2023-10-23 MED ORDER — ORAL CARE MOUTH RINSE: 15.0000 mL | Freq: Once | OROMUCOSAL | Status: AC

## 2023-10-23 MED ORDER — DOCUSATE SODIUM 100 MG PO CAPS
100.0000 mg | ORAL_CAPSULE | Freq: Two times a day (BID) | ORAL | Status: DC
  Administered 2023-10-23 – 2023-10-30 (×12): 100 mg via ORAL
  Filled 2023-10-23 (×14): qty 1

## 2023-10-23 MED ORDER — ROCURONIUM BROMIDE 10 MG/ML (PF) SYRINGE
PREFILLED_SYRINGE | INTRAVENOUS | Status: DC | PRN
  Administered 2023-10-23: 20 mg via INTRAVENOUS
  Administered 2023-10-23: 40 mg via INTRAVENOUS

## 2023-10-23 MED ORDER — ONDANSETRON HCL 4 MG PO TABS: 4.0000 mg | ORAL_TABLET | Freq: Four times a day (QID) | ORAL | Status: DC | PRN

## 2023-10-23 SURGICAL SUPPLY — 27 items
BAG COUNTER SPONGE SURGICOUNT (BAG) ×1 IMPLANT
BLADE SAW SGTL 18X1.27X75 (BLADE) ×1 IMPLANT
COVER PERINEAL POST (MISCELLANEOUS) ×1 IMPLANT
COVER SURGICAL LIGHT HANDLE (MISCELLANEOUS) ×1 IMPLANT
CUP ACET PINNACLE SECTR 48MM (Joint) IMPLANT
DRAPE FOOT SWITCH (DRAPES) ×1 IMPLANT
DRAPE STERI IOBAN 125X83 (DRAPES) ×1 IMPLANT
DRAPE U-SHAPE 47X51 STRL (DRAPES) ×2 IMPLANT
DRSG AQUACEL AG ADV 3.5X10 (GAUZE/BANDAGES/DRESSINGS) ×1 IMPLANT
DURAPREP 26ML APPLICATOR (WOUND CARE) ×1 IMPLANT
ELECT REM PT RETURN 15FT ADLT (MISCELLANEOUS) ×1 IMPLANT
GAUZE XEROFORM 1X8 LF (GAUZE/BANDAGES/DRESSINGS) IMPLANT
GLOVE BIOGEL PI IND STRL 8 (GLOVE) ×2 IMPLANT
GOWN STRL REUS W/ TWL XL LVL3 (GOWN DISPOSABLE) ×2 IMPLANT
HEAD FEMORAL 32 CERAMIC (Hips) IMPLANT
KIT TURNOVER KIT A (KITS) IMPLANT
PACK ANTERIOR HIP CUSTOM (KITS) ×1 IMPLANT
PINN ALTRX NEUT ID X OD 32X48 IMPLANT
PINNSECTOR W/GRIP ACE CUP 48MM (Joint) ×1 IMPLANT
SET HNDPC FAN SPRY TIP SCT (DISPOSABLE) ×1 IMPLANT
STAPLER SKIN PROX WIDE 3.9 (STAPLE) IMPLANT
STEM FEM SZ3 STD ACTIS (Stem) IMPLANT
SUT ETHIBOND NAB CT1 #1 30IN (SUTURE) ×1 IMPLANT
SUT VIC AB 0 CT1 36 (SUTURE) ×1 IMPLANT
SUT VIC AB 1 CT1 36 (SUTURE) ×1 IMPLANT
SUT VIC AB 2-0 CT1 TAPERPNT 27 (SUTURE) ×2 IMPLANT
YANKAUER SUCT BULB TIP NO VENT (SUCTIONS) ×1 IMPLANT

## 2023-10-23 NOTE — Anesthesia Procedure Notes (Signed)
 Procedure Name: Intubation Date/Time: 10/23/2023 9:24 AM  Performed by: Dairl Ponder, CRNAPre-anesthesia Checklist: Patient identified, Emergency Drugs available, Suction available and Patient being monitored Patient Re-evaluated:Patient Re-evaluated prior to induction Oxygen Delivery Method: Circle System Utilized Preoxygenation: Pre-oxygenation with 100% oxygen Induction Type: IV induction Ventilation: Mask ventilation without difficulty Laryngoscope Size: Mac and 3 Grade View: Grade I Tube type: Oral Tube size: 7.0 mm Number of attempts: 1 Airway Equipment and Method: Stylet and Oral airway Placement Confirmation: ETT inserted through vocal cords under direct vision, positive ETCO2 and breath sounds checked- equal and bilateral Secured at: 22 cm Tube secured with: Tape Dental Injury: Teeth and Oropharynx as per pre-operative assessment

## 2023-10-23 NOTE — Evaluation (Signed)
 Physical Therapy Evaluation Patient Details Name: Candace Bailey MRN: [id number] DOB: 10/28/1970 Today's Date: 10/23/2023  History of Present Illness  Pt is a 53 year old female s/p L THA on 10/23/23.  PMHx: TBI in 1973, speech abnormality, left hemiplegia  Clinical Impression  Pt is s/p THA resulting in the deficits listed below (see PT Problem List).  Pt will benefit from acute skilled PT to increase their independence and safety with mobility to facilitate discharge.   Pt assisted with ambulating short distance however currently requiring mod +2 assist (for safety).  Pt is typically modified independent at home with upright walker and family present today for session.  Mom reports pt will need to return to mod I to d/c home.  Pt was working with OP neuro PT prior to surgery.  Pt may benefit from HHPT initially for safety upon return home.         If plan is discharge home, recommend the following: A little help with walking and/or transfers;A little help with bathing/dressing/bathroom;Help with stairs or ramp for entrance;Assistance with cooking/housework;Assist for transportation   Can travel by private vehicle        Equipment Recommendations None recommended by PT  Recommendations for Other Services       Functional Status Assessment Patient has had a recent decline in their functional status and demonstrates the ability to make significant improvements in function in a reasonable and predictable amount of time.     Precautions / Restrictions Precautions Precautions: Fall Precaution/Restrictions Comments: L AFO, works better in Administrator, sports Restrictions Per Provider Order: No Other Position/Activity Restrictions: WBAT      Mobility  Bed Mobility Overal bed mobility: Needs Assistance Bed Mobility: Supine to Sit     Supine to sit: Mod assist, HOB elevated     General bed mobility comments: pt able to bring LEs over EOB, assist for scooting to EOB;  donned shoes and AFO for pt at EOB    Transfers Overall transfer level: Needs assistance Equipment used: Ambulation equipment used (EVA) Transfers: Sit to/from Stand Sit to Stand: Mod assist, +2 physical assistance           General transfer comment: cues for technique; pt requiring assist to rise, stabilize and control descent; assist for good placement of UEs on EVA    Ambulation/Gait Ambulation/Gait assistance: Mod assist, +2 physical assistance Gait Distance (Feet): 6 Feet Assistive device: Rolling walker (2 wheels) Gait Pattern/deviations: Step-to pattern, Knee flexed in stance - left Gait velocity: decr     General Gait Details: heavily reliant on upper body support with significant forward trunk lean; cues for sequence and posture; assist for weakness especially with pt advancing L LE  Stairs            Wheelchair Mobility     Tilt Bed    Modified Rankin (Stroke Patients Only)       Balance Overall balance assessment: Needs assistance         Standing balance support: Bilateral upper extremity supported, Reliant on assistive device for balance Standing balance-Leahy Scale: Poor                               Pertinent Vitals/Pain Pain Assessment Pain Assessment: Faces Faces Pain Scale: Hurts little more Pain Location: left hip Pain Descriptors / Indicators: Sore, Grimacing, Guarding Pain Intervention(s): Repositioned, Monitored during session    Home Living Family/patient expects to be discharged  to:: Private residence Living Arrangements: Alone Available Help at Discharge: Family Type of Home: Other(Comment) (Condo) Home Access: Level entry       Home Layout: One level Home Equipment: Rollator (4 wheels);Transport chair Additional Comments: very independent, prefers not to have assist, was working with OP neuro PT prior to surgery (mom and dad cannot provide assist)    Prior Function Prior Level of Function :  Independent/Modified Independent             Mobility Comments: uses upright walker ADLs Comments: mod I     Extremity/Trunk Assessment        Lower Extremity Assessment Lower Extremity Assessment: Generalized weakness;LLE deficits/detail LLE Deficits / Details: L AFO, hx hemiparesis; anticipated post op hip weakness and pain       Communication   Communication Communication: No apparent difficulties    Cognition Arousal: Alert Behavior During Therapy: WFL for tasks assessed/performed                           PT - Cognition Comments: hx TBI Following commands: Intact       Cueing Cueing Techniques: Verbal cues, Tactile cues     General Comments      Exercises     Assessment/Plan    PT Assessment Patient needs continued PT services  PT Problem List Decreased strength;Decreased activity tolerance;Decreased balance;Decreased mobility;Decreased knowledge of use of DME;Decreased coordination;Impaired tone       PT Treatment Interventions Gait training;DME instruction;Balance training;Neuromuscular re-education;Functional mobility training;Therapeutic activities;Therapeutic exercise;Patient/family education    PT Goals (Current goals can be found in the Care Plan section)  Acute Rehab PT Goals Patient Stated Goal: return to mod I to d/c home PT Goal Formulation: With patient/family Time For Goal Achievement: 11/06/23 Potential to Achieve Goals: Good    Frequency 7X/week     Co-evaluation               AM-PAC PT "6 Clicks" Mobility  Outcome Measure Help needed turning from your back to your side while in a flat bed without using bedrails?: A Lot Help needed moving from lying on your back to sitting on the side of a flat bed without using bedrails?: A Lot Help needed moving to and from a bed to a chair (including a wheelchair)?: A Lot Help needed standing up from a chair using your arms (e.g., wheelchair or bedside chair)?: Total Help  needed to walk in hospital room?: Total Help needed climbing 3-5 steps with a railing? : Total 6 Click Score: 9    End of Session Equipment Utilized During Treatment: Gait belt Activity Tolerance: Patient tolerated treatment well Patient left: in chair;with call bell/phone within reach;with chair alarm set;with family/visitor present Nurse Communication: Mobility status PT Visit Diagnosis: Difficulty in walking, not elsewhere classified (R26.2);Unsteadiness on feet (R26.81)    Time: 5621-3086 PT Time Calculation (min) (ACUTE ONLY): 31 min   Charges:   PT Evaluation $PT Eval Low Complexity: 1 Low PT Treatments $Gait Training: 8-22 mins PT General Charges $$ ACUTE PT VISIT: 1 Visit        Thomasene Mohair PT, DPT Physical Therapist Acute Rehabilitation Services Office: [phone]   Candace Bailey 10/23/2023, 4:54 PM

## 2023-10-23 NOTE — Plan of Care (Signed)

## 2023-10-23 NOTE — Op Note (Signed)
 Operative Note  Date of operation: 10/23/2023 Preoperative diagnosis: Left hip primary osteoarthritis Postoperative diagnosis: Same  Procedure: Left direct anterior total hip arthroplasty  Implants: Implant Name Type Inv. Item Serial No. Manufacturer Lot No. LRB No. Used Action  PINNSECTOR W/GRIP ACE CUP - [id number] Joint PINNSECTOR W/GRIP ACE CUP  [employer] [id number] Left 1 Implanted  PINN ALTRX NEUT ID X OD [id number] - [id number]  PINN ALTRX NEUT ID X OD [id number]  DEPUY ORTH[employer] [id number] Left 1 Implanted  STEM FEM SZ3 STD ACTIS - [id number] Stem STEM FEM SZ3 STD ACTIS  DEPUY ORTH[employer]ICS [id number] Left 1 Implanted  HEAD FEMORAL 32 CERAMIC - [id number] Hips HEAD FEMORAL 32 CERAMIC  [employer]PU[employer]TH[employer] [id number] Left 1 Implanted   Surgeon: Vanita Panda. Magnus Ivan, MD Assistant: Rexene Edison, PA-C  Anesthesia: #1 General, #2 local EBL: 100 cc Antibiotics: IV Ancef Complications: None  Indications: The patient is a very pleasant 53 year old female with debilitating arthritis involving her left hip.  This has been well-documented with clinical exam findings and x-ray findings.  She does have a history of a traumatic brain injury and does ambulate with a stand-up walker.  She is slow mobility which is significantly limited.  She has a leg length difference as well without left leg shorter than the right and significant atrophy of the left lower extremity comparing the left and right.  She has had an intra-articular steroid injection in that left hip which helped quite a bit in the joint.  At this point though her left hip pain is daily and it is definitely detrimentally affecting her mobility, her quality of life and her actives daily living.  The patient and her mother would like [place] to proceed with surgery and I agree with this as well based on the failure conservative treatment combined with observing her mobility, assessing her x-rays and her clinical exam findings.  We did discuss the  risks of acute blood loss anemia, nerve or vessel injury, fracture, infection, DVT, dislocation, implant failure, leg length differences and wound healing issues.  They understand that our goals are hopefully decreased pain, improved mobility, and improved quality of life.  Procedure description: After informed consent was obtained and the appropriate left hip was marked, the patient was brought to the operating room and general anesthesia was obtained while she is on the stretcher.  I assessed her leg lengths and she is definitely shorter on the left in the right.  Traction boots were placed on both her feet and she was next put supine on the Hana fracture table with a perineal post in place in both legs in inline skeletal traction devices no traction applied.  We then assessed her pelvis and left hip radiographically.  It actually looks like she is a little longer on the left side but we know clinically she is not so we will compensate that with our intraoperative x-rays.  The left hip was prepped and draped in DuraPrep and sterile drapes.  A time out was called and she was identified as the correct patient and the correct left hip.  An incision was then made just inferior and posterior to the ASIS and slightly taken obliquely down the leg.  Dissection was carried down to the tensor fascia lata muscle and the tensor fascia was divided longitudinally to proceed with a direct interposed the hip.  Circumflex vessels were identified and cauterized.  The hip capsule identified and opened up in L-type format finding a large joint effusion.  [employer]  retractors were placed around the medial and lateral femoral neck and a femoral neck cut was made just above the lesser trochanter proximally and this was completed with an osteotome.  The cut was made with an oscillating saw.  A corkscrew guide is placed in the femoral head the femoral head was removed its entirety.  There was a wide area completely devoid of cartilage.  A  bent Hohmann was then placed over the medial sterile rim and remnants of the acetabular labrum and other debris removed.  Reaming was then initiated from a size 43 reamer and stepwise increments going up to a size 47 reamer with all reamers placed under direct visualization and the reamers were also placed under direct fluoroscopy in order to obtain the depth reaming, the inclination and the anteversion.  The real De[employer] Sectra GRIPTION acetabular component size 48 was then placed without difficulty followed by a 32+4 polythene liner.  Attention was then turned to the femur.  With the right leg externally rotated to 120 degrees, extended and adducted, a Mueller retractor was placed medially and a Hohmann retractor behind the greater trochanter.  The lateral joint capsule was released and a box cutting osteotome was used to enter the femoral canal.  Broaching was then initiated using the Actis broaching system from a size 0 going to a size 3.  With a size 3 in place we trialed a standard offset femoral neck and a 32+1 trial hip ball.  We brought the leg over and up and with traction and internal rotation was able to reduce in the pelvis but is quite difficult to reduce.  We assessed it radiographically and clinically and we definitely felt like we had a stable hip.  We have improved her leg length as well.  We had definitely a difficult time again dislocated the hip which was good from a stability standpoint given her mobility issues and deformity issues.  We then placed the real Actis femoral component with standard offset size 3 and the real 32+1 ceramic head ball and again reduces now stabbing it was very tight and stable and we assessed it radiographically and clinically and we are pleased.  We then irrigated the soft tissue with normal saline solution.  The joint capsule remnants were closed with interrupted #1 Vicryl suture followed by number Vicryl goes tensor fascia.  0 Vicryl was used to close the deep tissue  and 2-0 Vicryl was used to close subcutaneous tissue.  The skin was closed with staples.  Xeroform and Aquacel dressing was applied.  The patient was awakened, extubated and taken the recovery room in stable condition.  Rexene Edison, PA-C did assist during interrogation beginning to end and his assistance was crucial and medically necessary for soft tissue management and retraction, helping guide implant placement and a layered closure of the wound.

## 2023-10-23 NOTE — Interval H&P Note (Signed)
 History and Physical Interval Note: The patient understands that she is here today to treat severe debilitating arthritis of her left hip.  Her mother is at the bedside as well and agrees with proceeding with his surgery given the significant detrimental impact her hip pain is having on her quality of life and her mobility.  There has been no acute or interval change in her medical status.  The risks and benefits of surgery have been discussed and described in detail and informed consent has been obtained.  The left operative hip has been marked.  10/23/2023 8:59 AM  Candace Bailey  has presented today for surgery, with the diagnosis of Osteoarthritis left hip.  The various methods of treatment have been discussed with the patient and family. After consideration of risks, benefits and other options for treatment, the patient has consented to  Procedure(s): LEFT TOTAL HIP ARTHROPLASTY ANTERIOR APPROACH (Left) as a surgical intervention.  The patient's history has been reviewed, patient examined, no change in status, stable for surgery.  I have reviewed the patient's chart and labs.  Questions were answered to the patient's satisfaction.     Kathryne Hitch

## 2023-10-23 NOTE — Transfer of Care (Signed)
 Immediate Anesthesia Transfer of Care Note  Patient: Candace Bailey  Procedure(s) Performed: LEFT TOTAL HIP ARTHROPLASTY ANTERIOR APPROACH (Left: Hip)  Patient Location: PACU  Anesthesia Type:General  Level of Consciousness: sedated  Airway & Oxygen Therapy: Patient Spontanous Breathing and Patient connected to face mask oxygen  Post-op Assessment: Report given to RN and Post -op Vital signs reviewed and stable  Post vital signs: Reviewed and stable  Last Vitals:  Vitals Value Taken Time  BP 102/61 10/23/23 1105  Temp    Pulse 57 10/23/23 1107  Resp 14 10/23/23 1107  SpO2 100 % 10/23/23 1107  Vitals shown include unfiled device data.  Last Pain:  Vitals:   10/23/23 0723  TempSrc: Oral  PainSc: 0-No pain         Complications: No notable events documented.

## 2023-10-24 ENCOUNTER — Encounter (HOSPITAL_COMMUNITY): Payer: Self-pay | Admitting: Orthopaedic Surgery

## 2023-10-24 DIAGNOSIS — R404 Transient alteration of awareness: Secondary | ICD-10-CM | POA: Diagnosis not present

## 2023-10-24 DIAGNOSIS — M1612 Unilateral primary osteoarthritis, left hip: Secondary | ICD-10-CM | POA: Diagnosis present

## 2023-10-24 DIAGNOSIS — M25752 Osteophyte, left hip: Secondary | ICD-10-CM | POA: Diagnosis present

## 2023-10-24 DIAGNOSIS — M85652 Other cyst of bone, left thigh: Secondary | ICD-10-CM | POA: Diagnosis present

## 2023-10-24 DIAGNOSIS — I69828 Other speech and language deficits following other cerebrovascular disease: Secondary | ICD-10-CM | POA: Diagnosis not present

## 2023-10-24 DIAGNOSIS — Z96642 Presence of left artificial hip joint: Secondary | ICD-10-CM | POA: Diagnosis present

## 2023-10-24 DIAGNOSIS — Z9181 History of falling: Secondary | ICD-10-CM | POA: Diagnosis not present

## 2023-10-24 DIAGNOSIS — S0689AS Other specified intracranial injury with loss of consciousness status unknown, sequela: Secondary | ICD-10-CM | POA: Diagnosis not present

## 2023-10-24 DIAGNOSIS — Z743 Need for continuous supervision: Secondary | ICD-10-CM | POA: Diagnosis not present

## 2023-10-24 DIAGNOSIS — Z9889 Other specified postprocedural states: Secondary | ICD-10-CM | POA: Diagnosis not present

## 2023-10-24 DIAGNOSIS — I69854 Hemiplegia and hemiparesis following other cerebrovascular disease affecting left non-dominant side: Secondary | ICD-10-CM | POA: Diagnosis not present

## 2023-10-24 DIAGNOSIS — Z7401 Bed confinement status: Secondary | ICD-10-CM | POA: Diagnosis not present

## 2023-10-24 DIAGNOSIS — Z471 Aftercare following joint replacement surgery: Secondary | ICD-10-CM | POA: Diagnosis not present

## 2023-10-24 DIAGNOSIS — R2681 Unsteadiness on feet: Secondary | ICD-10-CM | POA: Diagnosis not present

## 2023-10-24 DIAGNOSIS — Z7982 Long term (current) use of aspirin: Secondary | ICD-10-CM | POA: Diagnosis not present

## 2023-10-24 DIAGNOSIS — Z8619 Personal history of other infectious and parasitic diseases: Secondary | ICD-10-CM | POA: Diagnosis not present

## 2023-10-24 DIAGNOSIS — M6281 Muscle weakness (generalized): Secondary | ICD-10-CM | POA: Diagnosis not present

## 2023-10-24 DIAGNOSIS — R1310 Dysphagia, unspecified: Secondary | ICD-10-CM | POA: Diagnosis not present

## 2023-10-24 DIAGNOSIS — Z91040 Latex allergy status: Secondary | ICD-10-CM | POA: Diagnosis not present

## 2023-10-24 DIAGNOSIS — I69351 Hemiplegia and hemiparesis following cerebral infarction affecting right dominant side: Secondary | ICD-10-CM | POA: Diagnosis not present

## 2023-10-24 DIAGNOSIS — M2170 Unequal limb length (acquired), unspecified site: Secondary | ICD-10-CM | POA: Diagnosis present

## 2023-10-24 DIAGNOSIS — R2689 Other abnormalities of gait and mobility: Secondary | ICD-10-CM | POA: Diagnosis not present

## 2023-10-24 DIAGNOSIS — I69851 Hemiplegia and hemiparesis following other cerebrovascular disease affecting right dominant side: Secondary | ICD-10-CM | POA: Diagnosis not present

## 2023-10-24 DIAGNOSIS — G8111 Spastic hemiplegia affecting right dominant side: Secondary | ICD-10-CM | POA: Diagnosis not present

## 2023-10-24 LAB — CBC
HCT: 31.7 % — ABNORMAL LOW (ref 36.0–46.0)
Hemoglobin: 10.5 g/dL — ABNORMAL LOW (ref 12.0–15.0)
MCH: 29.8 pg (ref 26.0–34.0)
MCHC: 33.1 g/dL (ref 30.0–36.0)
MCV: 90.1 fL (ref 80.0–100.0)
Platelets: 264 10*3/uL (ref 150–400)
RBC: 3.52 MIL/uL — ABNORMAL LOW (ref 3.87–5.11)
RDW: 12.7 % (ref 11.5–15.5)
WBC: 10.9 10*3/uL — ABNORMAL HIGH (ref 4.0–10.5)
nRBC: 0 % (ref 0.0–0.2)

## 2023-10-24 LAB — BASIC METABOLIC PANEL WITH GFR
Anion gap: 9 (ref 5–15)
BUN: 14 mg/dL (ref 6–20)
CO2: 23 mmol/L (ref 22–32)
Calcium: 8.3 mg/dL — ABNORMAL LOW (ref 8.9–10.3)
Chloride: 105 mmol/L (ref 98–111)
Creatinine, Ser: 0.71 mg/dL (ref 0.44–1.00)
GFR, Estimated: >60 mL/min (ref >60)
Glucose, Bld: 140 mg/dL — ABNORMAL HIGH (ref 70–99)
Potassium: 3.9 mmol/L (ref 3.5–5.1)
Sodium: 137 mmol/L (ref 135–145)

## 2023-10-24 NOTE — Anesthesia Postprocedure Evaluation (Signed)
 Anesthesia Post Note  Patient: Brixton Katzenstein  Procedure(s) Performed: LEFT TOTAL HIP ARTHROPLASTY ANTERIOR APPROACH (Left: Hip)     Patient location during evaluation: PACU Anesthesia Type: General Level of consciousness: sedated and patient cooperative Pain management: pain level controlled Vital Signs Assessment: post-procedure vital signs reviewed and stable Respiratory status: spontaneous breathing Cardiovascular status: stable Anesthetic complications: no   No notable events documented.  Last Vitals:  Vitals:   10/24/23 0535 10/24/23 0720  BP: 121/60 98/66  Pulse: 82 71  Resp: 17 15  Temp: 36.6 C 36.8 C  SpO2: 97% 98%    Last Pain:  Vitals:   10/24/23 0901  TempSrc:   PainSc: 1                  Lewie Loron

## 2023-10-24 NOTE — Progress Notes (Signed)
 Inpatient Rehab Admissions Coordinator:    CIR consult received. I do not think she demonstrates medical necessity for an acute inpatient rehab admission and do not think payor would approve an admission. I will not pursue for admit. CIR will sign off.   Megan Salon, MS, CCC-SLP Rehab Admissions Coordinator  [phone] (celll) [phone] (office)

## 2023-10-24 NOTE — Evaluation (Signed)
 Occupational Therapy Evaluation Patient Details Name: Candace Bailey MRN: [id number] DOB: 1971-04-06 Today's Date: 10/24/2023   History of Present Illness   Pt is a 53 year old female s/p L THA on 10/23/23.  PMHx: TBI in 1973, speech abnormality, left spastic hemiplegia and Right UE ataxia     Clinical Impressions Patient is currently requiring as high as Max assistance of 2 with basic ADLs, as well as inability to tolerate taking an anterior step with 2 different Rws attempted, and near fall/collapse with one step with RT foot to catch up to LT. Pt did stand from recliner to walkers x 3 reps with Min As.   Current level of function is below patient's typical baseline of Modified independent.    During this evaluation, patient was limited by baseline LT spastic hemiplegia, RUE ataxia, hypotonia to all muscles, bradykinesia, generalized weakness, impaired activity tolerance, and post-op pain with movement, all of which has the potential to impact patient's and/or caregivers' safety and independence during functional mobility, as well as performance for ADLs.    Patient lives alone with a family member who plans to stay with her for 2 week, but who would currently be unable to safely provide the necessary supervision and assistance that pt currently requires.  Patient demonstrates good rehab potential, and should benefit from continued skilled occupational therapy services while in acute care to maximize safety, independence and quality of life at home.    Patient will benefit from intensive inpatient follow-up therapy, >3 hours/day.  If not able, consider SNF. If pt refuses, OT recommends HH PT/OT/Aide, possible DME needs of sliding board vs mechanical lift and motorized wheelchair with 24/7 care until pt closer to her baseline.   ?      If plan is discharge home, recommend the following:   Two people to help with walking and/or transfers;A lot of help with walking and/or transfers;A lot of  help with bathing/dressing/bathroom;Assistance with cooking/housework;Assist for transportation     Functional Status Assessment   Patient has had a recent decline in their functional status and demonstrates the ability to make significant improvements in function in a reasonable and predictable amount of time.     Equipment Recommendations   BSC/3in1 (depending on progress and discharge location, may be more needs.)     Recommendations for Other Services         Precautions/Restrictions   Precautions Precautions: Fall Precaution/Restrictions Comments: L AFO, works better in Archivist Weight Bearing Restrictions Per Provider Order: No Other Position/Activity Restrictions: WBAT     Mobility Bed Mobility               General bed mobility comments: Pt recevied in recliner.    Transfers                          Balance Overall balance assessment: Needs assistance Sitting-balance support: Feet supported, Single extremity supported Sitting balance-Leahy Scale: Good     Standing balance support: Bilateral upper extremity supported, Reliant on assistive device for balance Standing balance-Leahy Scale: Zero (Poor static, Zero dynamic)                             ADL either performed or assessed with clinical judgement   ADL Overall ADL's : Needs assistance/impaired (All tasks with bradykinesia, requiring increased time and cues to keep pt focused on task. Multiple family members in room all talking) Eating/Feeding:  Set up;Sitting   Grooming: Minimal assistance;Sitting;Bed level   Upper Body Bathing: Minimal assistance;Sitting;Moderate assistance   Lower Body Bathing: Bed level;Maximal assistance;+2 for physical assistance   Upper Body Dressing : Moderate assistance;Sitting   Lower Body Dressing: Moderate assistance;Maximal assistance;Sitting/lateral leans;Bed level Lower Body Dressing Details (indicate cue type and reason):  Pt began to secure AFO with Mod As provided for time as pt moving very slowly stating, "I need to wake up". Total Assist to don shoe over AFO in part due to gripper socks causing increased difficulty Toilet Transfer: Minimal assistance;Rolling walker (2 wheels);Rollator (4 wheels);+2 for physical assistance;+2 for safety/equipment;Cueing for safety;Cueing for sequencing (Min As to stand only) Statistician Details (indicate cue type and reason): Unable to ambulate or pivot trying RW and Carley Hammed walker. Pt stood from recliner to RW x 2 reps with Min As 1st then CGA 2nd to power up, but Min As needed for pt to achive more upright position. Pt hypotonic through wrists and despite PT giving grip position adaptations, pt was unable to support self with use of UB to step. Pt then stood 3rd time to EVA walker with CGA->Min As leaning LT into OT for support. Pt advanced LT foot and perform one small step with RT but nearly collapsed. Recliner broughout behind pt who was lowered to chair. Toileting- Clothing Manipulation and Hygiene: Total assistance;Bed level Toileting - Clothing Manipulation Details (indicate cue type and reason): Purwe wick soiled and removed. Pt reports can usually control B/B.  Did not successfully pivot this session with either walker. Pt would require bear hug assist transfer or bed pan.     Functional mobility during ADLs: Minimal assistance;Maximal assistance;Rollator (4 wheels);Rolling walker (2 wheels);+2 for physical assistance;+2 for safety/equipment       Vision   Vision Assessment?: No apparent visual deficits     Perception         Praxis         Pertinent Vitals/Pain Pain Assessment Pain Assessment: PAINAD Breathing: normal Negative Vocalization: none Facial Expression: sad, frightened, frown Body Language: relaxed Consolability: distracted or reassured by voice/touch PAINAD Score: 2 Pain Location: left hip Pain Descriptors / Indicators: Sore, Grimacing,  Guarding Pain Intervention(s): Limited activity within patient's tolerance, Monitored during session, Repositioned, Ice applied     Extremity/Trunk Assessment Upper Extremity Assessment Upper Extremity Assessment: RUE deficits/detail;LUE deficits/detail RUE Deficits / Details: Limited ROM, hypotonic and ataxic movements with dysmetria. RUE Coordination: decreased gross motor;decreased fine motor LUE Deficits / Details: h/o spastic hemiplegia, hypotonic but does seem to favor LT hand with increased control, less dysmetria and more functional ebow->Hand. LUE Coordination: decreased fine motor;decreased gross motor   Lower Extremity Assessment LLE Deficits / Details: L AFO, hx hemiparesis; anticipated post op hip weakness and pain       Communication Communication Communication: Impaired Factors Affecting Communication: Reduced clarity of speech   Cognition Arousal: Alert Behavior During Therapy: WFL for tasks assessed/performed               OT - Cognition Comments: h/o TBI in 1973. Is easily distracted but able to redirect when needed, needs reassurance and education to understand typical post-op change to function.  Per family, pt is the one refusing rehab, however pt may benefit and could use more education.                 Following commands: Intact       Cueing  General Comments   Cueing Techniques: Verbal cues;Tactile cues  Exercises Other Exercises Other Exercises: Family education on uses of [facility] including ease of clean up and use over toilet with angles for comfort and splash guard. Problem solving discussion with pt, family, PT on DME that would properly support pt for ambulation.   Shoulder Instructions      Home Living Family/patient expects to be discharged to:: Private residence Living Arrangements: Alone Available Help at Discharge: Family Type of Home: Other(Comment) (Condo) Home Access: Level entry     Home Layout: One level      Bathroom Shower/Tub: Producer, television/film/video: Standard     Home Equipment: Rollator (4 wheels);Transport chair;Shower seat;Hand held shower head   Additional Comments: very independent, prefers not to have assist, was working with OP neuro PT prior to surgery (mom and dad cannot provide assist, but will have family member (sister?) stay with her 24/7 for 1st two weeks post-op.      Prior Functioning/Environment Prior Level of Function : Independent/Modified Independent             Mobility Comments: uses Rollator. ADLs Comments: mod I    OT Problem List: Impaired tone;Obesity   OT Treatment/Interventions: Self-care/ADL training;Therapeutic activities;Therapeutic exercise;Neuromuscular education;Patient/family education;Balance training;DME and/or AE instruction;Manual therapy      OT Goals(Current goals can be found in the care plan section)   Acute Rehab OT Goals Patient Stated Goal: To go home and resume bus rides as pt enjoys eating out. OT Goal Formulation: With patient/family Time For Goal Achievement: 11/07/23 Potential to Achieve Goals: Fair ADL Goals Pt Will Perform Lower Body Bathing: with contact guard assist;sitting/lateral leans;with adaptive equipment Pt Will Perform Lower Body Dressing: with modified independence;with adaptive equipment (May benefit from loose clothing and suspenders to pull up pants. Unsure UEs can manipulate AE) Pt Will Transfer to Toilet: with modified independence;ambulating;bedside commode Pt Will Perform Toileting - Clothing Manipulation and hygiene: with adaptive equipment;with modified independence (Pt's mother familiar with bidets and researching best one for pt) Additional ADL Goal #1: Patient/CGs will identify at least 3 fall prevention strategies to employ at home in order to maximize function and safety during ADLs and decrease caregiver burden while preventing possible injury and rehospitalization.   OT Frequency:  Min  1X/week    Co-evaluation PT/OT/SLP Co-Evaluation/Treatment: Yes Reason for Co-Treatment: Complexity of the patient's impairments (multi-system involvement);For patient/therapist safety;To address functional/ADL transfers PT goals addressed during session: Mobility/safety with mobility OT goals addressed during session: ADL's and self-care      AM-PAC OT "6 Clicks" Daily Activity     Outcome Measure Help from another person eating meals?: A Little Help from another person taking care of personal grooming?: A Little Help from another person toileting, which includes using toliet, bedpan, or urinal?: A Lot Help from another person bathing (including washing, rinsing, drying)?: A Lot Help from another person to put on and taking off regular upper body clothing?: A Lot Help from another person to put on and taking off regular lower body clothing?: A Lot 6 Click Score: 14   End of Session Equipment Utilized During Treatment: Gait belt;Rolling walker (2 wheels);Rollator (4 wheels);Other (comment) (LT AFO) Nurse Communication: Mobility status  Activity Tolerance: Patient limited by pain Patient left: in chair;with call bell/phone within reach;with chair alarm set;with family/visitor present;Other (comment) (PT with pt)  OT Visit Diagnosis: Unsteadiness on feet (R26.81);Other symptoms and signs involving the nervous system (R29.898);Ataxia, unspecified (R27.0);History of falling (Z91.81);Muscle weakness (generalized) (M62.81);Other abnormalities of gait and mobility (R26.89);Hemiplegia and hemiparesis Hemiplegia -  Right/Left: Left Hemiplegia - caused by:  (TBI 1973)                Time: 5784-6962 OT Time Calculation (min): 50 min Charges:  OT General Charges $OT Visit: 1 Visit OT Evaluation $OT Eval Moderate Complexity: 1 Mod OT Treatments $Self Care/Home Management : 8-22 mins  Victorino Dike, OT Acute Rehab Services Office: [phone] 10/24/2023   Theodoro Clock 10/24/2023, 12:40  PM

## 2023-10-24 NOTE — Progress Notes (Signed)
 Physical Therapy Treatment Patient Details Name: Candace Bailey MRN: [id number] DOB: June 09, 1971 Today's Date: 10/24/2023   History of Present Illness Pt is a 53 year old female s/p L THA on 10/23/23.  PMHx: TBI in 1973, speech abnormality, left hemiplegia    PT Comments  POD 1 pm session, pt's mom and sister present, pt agreeable to therapy. Pt with improvement in LLE Muscle activation with exercises, able to perform quad set, SAQ in decreased ROM and hip abduction AAROM. Pt reports 4/10 pain with exercises, intermittent tactile cues to decrease compensations and increase ROM. Presented with bil platform walker this session, pt heavily dependent on BUE use to complete R foot step, tactile tapping on L quad and glute to improve activation in stance to prevent buckling, though improved activation noted pt still demonstrates closed chain weakness with weight acceptance. Pt demonstrates narrow BOS with feet brushing against one another and significantly decreased cadence with step to gait pattern. Pt reports 5/10 pain with ambulation, close chair follow for safety. Pt tolerates remaining up in chair; educated pt and family on exercises to perform while up in chair and transfers to [facility] with nursing staff to maximize mobility and both verbalize understanding.  Chart review noted regarding AIR recommendation. Continue to recommend post acute PT - SNF vs HHPT. Pt declined SNF suggestion during previous conversation regarding post acute PT, but would need to improve significantly prior to return home with family support. Pt currently needs +2 A to safely mobilize with limited mobility and is a high fall risk.    If plan is discharge home, recommend the following: Assistance with cooking/housework;Assist for transportation;Two people to help with walking and/or transfers;Two people to help with bathing/dressing/bathroom   Can travel by private vehicle        Equipment Recommendations  None recommended by PT     Recommendations for Other Services       Precautions / Restrictions Precautions Precautions: Fall Precaution/Restrictions Comments: L AFO, works better in shoes Restrictions Weight Bearing Restrictions Per Provider Order: No Other Position/Activity Restrictions: WBAT     Mobility  Bed Mobility               General bed mobility comments: Pt recevied in recliner.    Transfers Overall transfer level: Needs assistance Equipment used: Bilateral platform walker Transfers: Sit to/from Stand Sit to Stand: Min assist, +2 safety/equipment           General transfer comment: STS with RW needing mod A+2 to power up, verbal cues for hand placement, assist to power up and stabilize with lowering, steadying support with transitioning hands from armrests<>AD; STS with EVA walker, min A +2 to power up and steady with transferring BUE to EVA walker    Ambulation/Gait Ambulation/Gait assistance: Mod assist, +2 physical assistance Gait Distance (Feet): 10 Feet Assistive device: Rolling walker (2 wheels) Gait Pattern/deviations: Step-to pattern, Knee flexed in stance - left, Narrow base of support, Decreased stance time - left, Decreased weight shift to left Gait velocity: decreased     General Gait Details: multimodal cues for hip extension and knee extension and thoracic extension with bil platform RW, limited LLE stance and weightbearing with noted closed chain weakness needing tactile cues to activate quads and glutes to prevent LE buckling, narrow BOS with feet brushing against one another (mom reports pt with narrow BOS at baseline), heavy use of BUE on bil platform RW, close chair follow for safety   Stairs  Wheelchair Mobility     Tilt Bed    Modified Rankin (Stroke Patients Only)       Balance Overall balance assessment: Needs assistance Sitting-balance support: Feet supported, Single extremity supported Sitting balance-Leahy Scale: Good      Standing balance support: Bilateral upper extremity supported, Reliant on assistive device for balance Standing balance-Leahy Scale: Zero (Poor static, Zero dynamic) Standing balance comment: +2 dynamic                            Communication Communication Communication: No apparent difficulties Factors Affecting Communication: Reduced clarity of speech  Cognition Arousal: Alert Behavior During Therapy: WFL for tasks assessed/performed                           PT - Cognition Comments: hx TBI; pt easily distracted by external distractions, emotional regarding current functional status Following commands: Intact      Cueing Cueing Techniques: Verbal cues, Tactile cues  Exercises Total Joint Exercises Ankle Circles/Pumps: Seated, AROM, 10 reps Quad Sets: Seated, AROM, Both, 10 reps Short Arc Quad: Seated, AROM, Both, 10 reps Hip ABduction/ADduction: Seated, AAROM, Left, 10 reps    General Comments        Pertinent Vitals/Pain Pain Assessment Pain Assessment: 0-10 Pain Score: 5  Faces Pain Scale: Hurts little more Pain Location: left hip Pain Descriptors / Indicators: Sore, Grimacing, Guarding Pain Intervention(s): Limited activity within patient's tolerance, Monitored during session, Repositioned    Home Living Family/patient expects to be discharged to:: Private residence Living Arrangements: Alone Available Help at Discharge: Family Type of Home: Other(Comment) (Condo) Home Access: Level entry       Home Layout: One level Home Equipment: Rollator (4 wheels);Transport chair;Shower seat;Hand held shower head Additional Comments: very independent, prefers not to have assist, was working with OP neuro PT prior to surgery (mom and dad cannot provide assist, but will have family member (sister?) stay with her 24/7 for 1st two weeks post-op.    Prior Function            PT Goals (current goals can now be found in the care plan section) Acute  Rehab PT Goals Patient Stated Goal: return to mod I to d/c home PT Goal Formulation: With patient/family Time For Goal Achievement: 11/06/23 Potential to Achieve Goals: Good Progress towards PT goals: Progressing toward goals    Frequency    7X/week      PT Plan      Co-evaluation   Reason for Co-Treatment: Complexity of the patient's impairments (multi-system involvement);For patient/therapist safety;To address functional/ADL transfers PT goals addressed during session: Mobility/safety with mobility OT goals addressed during session: ADL's and self-care      AM-PAC PT "6 Clicks" Mobility   Outcome Measure  Help needed turning from your back to your side while in a flat bed without using bedrails?: A Lot Help needed moving from lying on your back to sitting on the side of a flat bed without using bedrails?: A Lot Help needed moving to and from a bed to a chair (including a wheelchair)?: Total Help needed standing up from a chair using your arms (e.g., wheelchair or bedside chair)?: Total Help needed to walk in hospital room?: Total Help needed climbing 3-5 steps with a railing? : Total 6 Click Score: 8    End of Session Equipment Utilized During Treatment: Gait belt Activity Tolerance: Patient limited by pain Patient  left: in chair;with call bell/phone within reach;with chair alarm set;with family/visitor present Nurse Communication: Mobility status PT Visit Diagnosis: Difficulty in walking, not elsewhere classified (R26.2);Unsteadiness on feet (R26.81)     Time: 9604-5409 PT Time Calculation (min) (ACUTE ONLY): 60 min  Charges:    $Gait Training: 23-37 mins $Therapeutic Exercise: 8-22 mins $Therapeutic Activity: 8-22 mins PT General Charges $$ ACUTE PT VISIT: 1 Visit                     Tori Xian Alves PT, DPT 10/24/23, 3:34 PM

## 2023-10-24 NOTE — TOC Initial Note (Addendum)
 Transition of Care [facility]) - Initial/Assessment Note    Patient Details  Name: Candace Bailey MRN: [id number] Date of Birth: [birth year]  [facility] CM/SW Contact:    Howell Rucks, RN Phone Number: 10/24/2023, 1:09 PM  Clinical Narrative:  Teams chat received from OT/PT with recommendation for Inpatient Rehab. NCM added Dr Magnus Ivan to chat,   states he is fine with CIR if they will see her and she qualifies. PT to update recommendation to CIR. TOC will continue to follow.       -1:57pm NCM call to pt's mother, Corrie Dandy, updated regarding CIR recommendation, voiced understanding, will update.        -2:24pm CIR eval completed,  per documentation does not meet medical necessity for CIR, has signed off.        Expected Discharge Plan: IP Rehab Facility Barriers to Discharge: Continued Medical Work up   Patient Goals and CMS Choice Patient states their goals for this hospitalization and ongoing recovery are:: return home          Expected Discharge Plan and Services       Living arrangements for the past 2 months: Apartment                                      Prior Living Arrangements/Services Living arrangements for the past 2 months: Apartment Lives with:: Parents Patient language and need for interpreter reviewed:: Yes        Need for Family Participation in Patient Care: Yes (Comment) Care giver support system in place?: Yes (comment) Current home services: DME (walker) Criminal Activity/Legal Involvement Pertinent to Current Situation/Hospitalization: No - Comment as needed  Activities of Daily Living   ADL Screening (condition at time of admission) Independently performs ADLs?: Yes (appropriate for developmental age) Is the patient deaf or have difficulty hearing?: No Does the patient have difficulty seeing, even when wearing glasses/contacts?: No Does the patient have difficulty concentrating, remembering, or making decisions?: No  Permission  Sought/Granted                  Emotional Assessment Appearance:: Appears stated age Attitude/Demeanor/Rapport: Gracious Affect (typically observed): Accepting Orientation: : Oriented to Self, Oriented to Place, Oriented to  Time, Oriented to Situation Alcohol / Substance Use: Not Applicable Psych Involvement: No (comment)  Admission diagnosis:  Status post total replacement of left hip [Z96.642] Patient Active Problem List   Diagnosis Date Noted   Status post total replacement of left hip 10/23/2023   Unilateral primary osteoarthritis, left hip 09/17/2023   Abnormal TSH 08/05/2011   C. difficile colitis 08/01/2011   Dehydration 08/01/2011   TBI (traumatic brain injury) (HCC) 07/31/2011   PCP:  Emilio Aspen, MD Pharmacy:   [id number] - [place], [place] - [address] [place] [address] Phone: [phone] Fax: [phone]     Social Drivers of Health (SDOH) Social History: SDOH Screenings   Food Insecurity: No Food Insecurity (10/23/2023)  Housing: Low Risk  (10/23/2023)  Transportation Needs: No Transportation Needs (10/23/2023)  Utilities: Not At Risk (10/23/2023)  Depression (PHQ2-9): Low Risk  (12/04/2020)  Tobacco Use: Low Risk  (10/23/2023)   SDOH Interventions:     Readmission Risk Interventions     No data to display

## 2023-10-24 NOTE — Progress Notes (Signed)
 Physical Therapy Treatment Patient Details Name: Candace Bailey MRN: [id number] DOB: 06/08/1971 Today's Date: 10/24/2023   History of Present Illness Pt is a 53 year old female s/p L THA on 10/23/23.  PMHx: TBI in 1973, speech abnormality, left hemiplegia    PT Comments  Pt up in recliner with mom and sister present. Pt significantly limited by L hip pain during session, inability to weightbear on LLE despite heavy BUE use on RW and EVA walker attempted with LLE buckling as weight transferred to L side preventing any successful R foot clearance or step. In long sitting in recliner, pt noted to have BLE adducted and L LE internally rotated, therapist providing PROM to abduct and externally rotate to more neutral hip position, pt trace muscle activation noted and pt c/o high pain. Attempted quad set and SAQ with multimodal cues, trace quad activation noted, unable to fully activate, also pt reporting pain limiting. Pt needing significant assistance with all mobility, unable to achieve successful R foot step forward with LLE buckling when attempting to accept weight. Patient with history of TBI and active with neuro OPPT prior to surgery; recommend benefit from intensive inpatient follow-up therapy, >3 hours/day.   If plan is discharge home, recommend the following: Assistance with cooking/housework;Assist for transportation;Two people to help with walking and/or transfers;Two people to help with bathing/dressing/bathroom   Can travel by private vehicle        Equipment Recommendations  None recommended by PT    Recommendations for Other Services       Precautions / Restrictions Precautions Precautions: Fall Precaution/Restrictions Comments: L AFO, works better in shoes Restrictions Weight Bearing Restrictions Per Provider Order: No Other Position/Activity Restrictions: WBAT     Mobility  Bed Mobility               General bed mobility comments: Pt recevied in recliner.     Transfers Overall transfer level: Needs assistance Equipment used: Rolling walker (2 wheels), Ambulation equipment used (EVA walker) Transfers: Sit to/from Stand Sit to Stand: Mod assist, Min assist, +2 physical assistance           General transfer comment: STS with RW needing mod A+2 to power up, verbal cues for hand placement, assist to power up and stabilize with lowering, steadying support with transitioning hands from armrests<>AD; STS with EVA walker, min A +2 to power up and steady with transferring BUE to EVA walker    Ambulation/Gait               General Gait Details: unable to successfully take forward step with R foot, able to lift and progress LLE forward ~2 inches, strong BUE use on RW and EVA walked with LLE buckling when attempting to lift RLE, pt becomes tearful and returns to sitting in recliner   Stairs             Wheelchair Mobility     Tilt Bed    Modified Rankin (Stroke Patients Only)       Balance Overall balance assessment: Needs assistance Sitting-balance support: Feet supported, Single extremity supported Sitting balance-Leahy Scale: Good     Standing balance support: Bilateral upper extremity supported, Reliant on assistive device for balance Standing balance-Leahy Scale: Zero (Poor static, Zero dynamic)                              Communication Communication Communication: No apparent difficulties Factors Affecting Communication: Reduced clarity of speech  Cognition Arousal: Alert Behavior During Therapy: WFL for tasks assessed/performed                           PT - Cognition Comments: hx TBI; pt appears easily distracted by multiple people and conversations in room, needing multimodal cues and redirection to task at hand with minimizing environmental distractions Following commands: Intact      Cueing Cueing Techniques: Verbal cues, Tactile cues  Exercises Total Joint Exercises Quad Sets:  Seated, Left (attempted, trace quad activation noted with multimodal cues) Short Arc Quad: Seated, Left (attempted, trace quad activation noted but unable to lift foot) Hip ABduction/ADduction: Seated, PROM, Left (plus out of internal rotation to more neutral hip position)    General Comments        Pertinent Vitals/Pain Pain Assessment Pain Assessment: Faces Faces Pain Scale: Hurts little more Pain Location: left hip Pain Descriptors / Indicators: Sore, Grimacing, Guarding Pain Intervention(s): Limited activity within patient's tolerance, Monitored during session, Repositioned, Ice applied    Home Living Family/patient expects to be discharged to:: Private residence Living Arrangements: Alone Available Help at Discharge: Family Type of Home: Other(Comment) (Condo) Home Access: Level entry       Home Layout: One level Home Equipment: Rollator (4 wheels);Transport chair;Shower seat;Hand held shower head Additional Comments: very independent, prefers not to have assist, was working with OP neuro PT prior to surgery (mom and dad cannot provide assist, but will have family member (sister?) stay with her 24/7 for 1st two weeks post-op.    Prior Function            PT Goals (current goals can now be found in the care plan section) Acute Rehab PT Goals Patient Stated Goal: return to mod I to d/c home PT Goal Formulation: With patient/family Time For Goal Achievement: 11/06/23 Potential to Achieve Goals: Good Progress towards PT goals: Progressing toward goals    Frequency    7X/week      PT Plan      Co-evaluation   Reason for Co-Treatment: Complexity of the patient's impairments (multi-system involvement);For patient/therapist safety;To address functional/ADL transfers PT goals addressed during session: Mobility/safety with mobility OT goals addressed during session: ADL's and self-care      AM-PAC PT "6 Clicks" Mobility   Outcome Measure  Help needed turning  from your back to your side while in a flat bed without using bedrails?: A Lot Help needed moving from lying on your back to sitting on the side of a flat bed without using bedrails?: A Lot Help needed moving to and from a bed to a chair (including a wheelchair)?: Total Help needed standing up from a chair using your arms (e.g., wheelchair or bedside chair)?: Total Help needed to walk in hospital room?: Total Help needed climbing 3-5 steps with a railing? : Total 6 Click Score: 8    End of Session Equipment Utilized During Treatment: Gait belt Activity Tolerance: Patient limited by pain Patient left: in chair;with call bell/phone within reach;with chair alarm set;with family/visitor present Nurse Communication: Mobility status PT Visit Diagnosis: Difficulty in walking, not elsewhere classified (R26.2);Unsteadiness on feet (R26.81)     Time: 1610-9604 PT Time Calculation (min) (ACUTE ONLY): 53 min  Charges:    $Therapeutic Exercise: 8-22 mins $Therapeutic Activity: 8-22 mins PT General Charges $$ ACUTE PT VISIT: 1 Visit  Tori Osborne Serio PT, DPT 10/24/23, 1:21 PM

## 2023-10-24 NOTE — TOC Transition Note (Addendum)
 Transition of Care [facility]) - Discharge Note   Patient Details  Name: Candace Bailey MRN: [id number] Date of Birth: 10-09-1970  Transition of Care [facility] CM/SW Contact:  Howell Rucks, RN Phone Number: 10/24/2023, 9:42 AM   Clinical Narrative:   NCM called to pt's mother, Corrie Dandy, to introduce role of TOC/NCM and review for dc planning, Mary confirmed [facility] PT w/[facility], reports patient has RW, no home DME needs. MOON explained and reviewed telephonically, Corrie Dandy gave verbal ok for NCM to complete form, copy left at pt's bedside as Corrie Dandy will be up to visit later, copy also sent certified mail.. No TOC needs.     Final next level of care: Home w Home Health Services Barriers to Discharge: No Barriers Identified   Patient Goals and CMS Choice Patient states their goals for this hospitalization and ongoing recovery are:: return home          Discharge Placement                       Discharge Plan and Services Additional resources added to the After Visit Summary for                                       Social Drivers of Health (SDOH) Interventions SDOH Screenings   Food Insecurity: No Food Insecurity (10/23/2023)  Housing: Low Risk  (10/23/2023)  Transportation Needs: No Transportation Needs (10/23/2023)  Utilities: Not At Risk (10/23/2023)  Depression (PHQ2-9): Low Risk  (12/04/2020)  Tobacco Use: Low Risk  (10/23/2023)     Readmission Risk Interventions     No data to display

## 2023-10-24 NOTE — Care Management Obs Status (Signed)
 MEDICARE OBSERVATION STATUS NOTIFICATION   Patient Details  Name: Candace Bailey MRN: [id number] Date of Birth: March 04, 1971   Medicare Observation Status Notification Given:  Yes    Howell Rucks, RN 10/24/2023, 9:34 AM

## 2023-10-24 NOTE — Progress Notes (Signed)
 Subjective: 1 Day Post-Op Procedure(s) (LRB): LEFT TOTAL HIP ARTHROPLASTY ANTERIOR APPROACH (Left) Patient reports pain as moderate.    Objective: Vital signs in last 24 hours: Temp:  [97.6 F (36.4 C)-99.1 F (37.3 C)] 97.9 F (36.6 C) (03/28 0535) Pulse Rate:  [53-92] 82 (03/28 0535) Resp:  [15-18] 17 (03/28 0535) BP: (102-133)/(60-80) 121/60 (03/28 0535) SpO2:  [97 %-100 %] 97 % (03/28 0535) Weight:  [59 kg] 59 kg (03/27 0723)  Intake/Output from previous day: 03/27 0701 - 03/28 0700 In: 1443 [P.O.:480; I.V.:863; IV Piggyback:100] Out: 1350 [Urine:1250; Blood:100] Intake/Output this shift: Total I/O In: -  Out: 650 [Urine:650]  Recent Labs    10/24/23 0527  HGB 10.5*   Recent Labs    10/24/23 0527  WBC 10.9*  RBC 3.52*  HCT 31.7*  PLT 264   Recent Labs    10/24/23 0527  NA 137  K 3.9  CL 105  CO2 23  BUN 14  CREATININE 0.71  GLUCOSE 140*  CALCIUM 8.3*   No results for input(s): "LABPT", "INR" in the last 72 hours.  Sensation intact distally Intact pulses distally Incision: scant drainage   Assessment/Plan: 1 Day Post-Op Procedure(s) (LRB): LEFT TOTAL HIP ARTHROPLASTY ANTERIOR APPROACH (Left) Up with therapy Will likely keep the patient here today in order to maximize therapy and mobility. Will also order OT to see the patient as well.     Kathryne Hitch 10/24/2023, 7:00 AM

## 2023-10-25 NOTE — Progress Notes (Signed)
 Physical Therapy Treatment Patient Details Name: Candace Bailey MRN: [id number] DOB: Sep 06, 1970 Today's Date: 10/25/2023   History of Present Illness Pt is a 53 year old female s/p L THA on 10/23/23.  PMHx: TBI in 1973, speech abnormality, left hemiplegia    PT Comments  Pt assisted with ambulating short distance.  Pt remains +2 assist for safety at this time however physical assist seems improved today compared to yesterday.  Pt also assisted with performing a few LE exercises.  Per Dr. Eliberto Ivory note this morning, pt will rehab here in preparation for return home.  Will continue to assist pt with safely mobilizing to prepare for return home.   If plan is discharge home, recommend the following: Assistance with cooking/housework;Assist for transportation;Two people to help with walking and/or transfers;Two people to help with bathing/dressing/bathroom   Can travel by private vehicle        Equipment Recommendations  None recommended by PT    Recommendations for Other Services       Precautions / Restrictions Precautions Precautions: Fall Precaution/Restrictions Comments: L AFO, works better in shoes Restrictions Weight Bearing Restrictions Per Provider Order: No Other Position/Activity Restrictions: WBAT     Mobility  Bed Mobility               General bed mobility comments: Pt in recliner.    Transfers Overall transfer level: Needs assistance Equipment used: Bilateral platform walker Transfers: Sit to/from Stand Sit to Stand: +2 safety/equipment, Mod assist           General transfer comment: assist to rise and place UEs on platforms, pt able to assist with controlling descent once hands placed back to armrests    Ambulation/Gait Ambulation/Gait assistance: +2 physical assistance, Min assist, Contact guard assist Gait Distance (Feet): 10 Feet Assistive device: Bilateral platform walker Gait Pattern/deviations: Step-to pattern, Knee flexed in stance - left,  Narrow base of support, Decreased stance time - left, Decreased weight shift to left Gait velocity: decreased     General Gait Details: pt with difficulty with left knee extension however likely had some weakness present prior to surgery  (Mom reports knee flexion prior to surgery); cues for sequence and posture; pt tends to heavily rely on upper body support and in increased trunk flexion, limited weight bearing observed on left, narrow BOS (also present prior to surgery); recliner following closely for safety   Stairs             Wheelchair Mobility     Tilt Bed    Modified Rankin (Stroke Patients Only)       Balance                                            Communication Communication Communication: No apparent difficulties  Cognition Arousal: Alert Behavior During Therapy: WFL for tasks assessed/performed                           PT - Cognition Comments: hx TBI Following commands: Intact      Cueing Cueing Techniques: Verbal cues, Tactile cues  Exercises Total Joint Exercises Quad Sets: Left, 10 reps, Limitations Quad Sets Limitations: trace quad contraction, unable to achieve full knee extension actively Heel Slides: AAROM, Left, 10 reps Hip ABduction/ADduction: AAROM, Left, 10 reps Other Exercises Other Exercises: gentle 30 sec holds x4 into external  rotation    General Comments        Pertinent Vitals/Pain Pain Assessment Pain Assessment: 0-10 Pain Score: 4  Pain Location: left hip Pain Descriptors / Indicators: Sore, Grimacing, Guarding Pain Intervention(s): Monitored during session, Repositioned, Ice applied    Home Living                          Prior Function            PT Goals (current goals can now be found in the care plan section) Progress towards PT goals: Progressing toward goals    Frequency    7X/week      PT Plan      Co-evaluation              AM-PAC PT "6 Clicks"  Mobility   Outcome Measure  Help needed turning from your back to your side while in a flat bed without using bedrails?: A Lot Help needed moving from lying on your back to sitting on the side of a flat bed without using bedrails?: A Lot Help needed moving to and from a bed to a chair (including a wheelchair)?: Total Help needed standing up from a chair using your arms (e.g., wheelchair or bedside chair)?: Total Help needed to walk in hospital room?: Total Help needed climbing 3-5 steps with a railing? : Total 6 Click Score: 8    End of Session Equipment Utilized During Treatment: Gait belt Activity Tolerance: Patient tolerated treatment well Patient left: in chair;with call bell/phone within reach;with chair alarm set;with family/visitor present Nurse Communication: Mobility status PT Visit Diagnosis: Difficulty in walking, not elsewhere classified (R26.2);Unsteadiness on feet (R26.81)     Time: 1610-9604 PT Time Calculation (min) (ACUTE ONLY): 29 min  Charges:    $Gait Training: 8-22 mins $Therapeutic Exercise: 8-22 mins PT General Charges $$ ACUTE PT VISIT: 1 Visit                     Paulino Door, DPT Physical Therapist Acute Rehabilitation Services Office: [phone]    Candace Bailey 10/25/2023, 1:49 PM

## 2023-10-25 NOTE — Discharge Instructions (Signed)
 Per [facility] clinic policy, our goal is ensure optimal postoperative pain control with a multimodal pain management strategy. For all [facility] patients, our goal is to wean post-operative narcotic medications by 6 weeks post-operatively. If this is not possible due to utilization of pain medication prior to surgery, your [facility] doctor will support your acute post-operative pain control for the first 6 weeks postoperatively, with a plan to transition you back to your primary pain team following that. Candace Bailey will work to ensure a Therapist, occupational.  INSTRUCTIONS AFTER JOINT REPLACEMENT   Remove items at home which could result in a fall. This includes throw rugs or furniture in walking pathways ICE to the affected joint every three hours while awake for 30 minutes at a time, for at least the first 3-5 days, and then as needed for pain and swelling.  Continue to use ice for pain and swelling. You may notice swelling that will progress down to the foot and ankle.  This is normal after surgery.  Elevate your leg when you are not up walking on it.   Continue to use the breathing machine you got in the hospital (incentive spirometer) which will help keep your temperature down.  It is common for your temperature to cycle up and down following surgery, especially at night when you are not up moving around and exerting yourself.  The breathing machine keeps your lungs expanded and your temperature down.   DIET:  As you were doing prior to hospitalization, we recommend a well-balanced diet.  DRESSING / WOUND CARE / SHOWERING  Keep the surgical dressing until follow up.  The dressing is water proof, so you can shower without any extra covering.  IF THE DRESSING FALLS OFF or the wound gets wet inside, change the dressing with sterile gauze.  Please use good hand washing techniques before changing the dressing.  Do not use any lotions or creams on the incision until instructed by your surgeon.     ACTIVITY  Increase activity slowly as tolerated, but follow the weight bearing instructions below.   No driving for 6 weeks or until further direction given by your physician.  You cannot drive while taking narcotics.  No lifting or carrying greater than 10 lbs. until further directed by your surgeon. Avoid periods of inactivity such as sitting longer than an hour when not asleep. This helps prevent blood clots.  You may return to work once you are authorized by your doctor.     WEIGHT BEARING   Weight bearing as tolerated with assist device (walker, cane, etc) as directed, use it as long as suggested by your surgeon or therapist, typically at least 4-6 weeks.   EXERCISES  Results after joint replacement surgery are often greatly improved when you follow the exercise, range of motion and muscle strengthening exercises prescribed by your doctor. Safety measures are also important to protect the joint from further injury. Any time any of these exercises cause you to have increased pain or swelling, decrease what you are doing until you are comfortable again and then slowly increase them. If you have problems or questions, call your caregiver or physical therapist for advice.   Rehabilitation is important following a joint replacement. After just a few days of immobilization, the muscles of the leg can become weakened and shrink (atrophy).  These exercises are designed to build up the tone and strength of the thigh and leg muscles and to improve motion. Often times heat used for twenty to thirty minutes before  working out will loosen up your tissues and help with improving the range of motion but do not use heat for the first two weeks following surgery (sometimes heat can increase post-operative swelling).   These exercises can be done on a training (exercise) mat, on the floor, on a table or on a bed. Use whatever works the best and is most comfortable for you.    Use music or television  while you are exercising so that the exercises are a pleasant break in your day. This will make your life better with the exercises acting as a break in your routine that you can look forward to.   Perform all exercises about fifteen times, three times per day or as directed.  You should exercise both the operative leg and the other leg as well.  Exercises include:   Quad Sets - Tighten up the muscle on the front of the thigh (Quad) and hold for 5-10 seconds.   Straight Leg Raises - With your knee straight (if you were given a brace, keep it on), lift the leg to 60 degrees, hold for 3 seconds, and slowly lower the leg.  Perform this exercise against resistance later as your leg gets stronger.  Leg Slides: Lying on your back, slowly slide your foot toward your buttocks, bending your knee up off the floor (only go as far as is comfortable). Then slowly slide your foot back down until your leg is flat on the floor again.  Angel Wings: Lying on your back spread your legs to the side as far apart as you can without causing discomfort.  Hamstring Strength:  Lying on your back, push your heel against the floor with your leg straight by tightening up the muscles of your buttocks.  Repeat, but this time bend your knee to a comfortable angle, and push your heel against the floor.  You may put a pillow under the heel to make it more comfortable if necessary.   A rehabilitation program following joint replacement surgery can speed recovery and prevent re-injury in the future due to weakened muscles. Contact your doctor or a physical therapist for more information on knee rehabilitation.    CONSTIPATION  Constipation is defined medically as fewer than three stools per week and severe constipation as less than one stool per week.  Even if you have a regular bowel pattern at home, your normal regimen is likely to be disrupted due to multiple reasons following surgery.  Combination of anesthesia, postoperative  narcotics, change in appetite and fluid intake all can affect your bowels.   YOU MUST use at least one of the following options; they are listed in order of increasing strength to get the job done.  They are all available over the counter, and you may need to use some, POSSIBLY even all of these options:    Drink plenty of fluids (prune juice may be helpful) and high fiber foods Colace 100 mg by mouth twice a day  Senokot for constipation as directed and as needed Dulcolax (bisacodyl), take with full glass of water  Miralax (polyethylene glycol) once or twice a day as needed.  If you have tried all these things and are unable to have a bowel movement in the first 3-4 days after surgery call either your surgeon or your primary doctor.    If you experience loose stools or diarrhea, hold the medications until you stool forms back up.  If your symptoms do not get better within 1 week  or if they get worse, check with your doctor.  If you experience "the worst abdominal pain ever" or develop nausea or vomiting, please contact the office immediately for further recommendations for treatment.   ITCHING:  If you experience itching with your medications, try taking only a single pain pill, or even half a pain pill at a time.  You can also use Benadryl over the counter for itching or also to help with sleep.   TED HOSE STOCKINGS:  Use stockings on both legs until for at least 2 weeks or as directed by physician office. They may be removed at night for sleeping.  MEDICATIONS:  See your medication summary on the "After Visit Summary" that nursing will review with you.  You may have some home medications which will be placed on hold until you complete the course of blood thinner medication.  It is important for you to complete the blood thinner medication as prescribed.  PRECAUTIONS:  If you experience chest pain or shortness of breath - call 911 immediately for transfer to the hospital emergency department.    If you develop a fever greater that 101 F, purulent drainage from wound, increased redness or drainage from wound, foul odor from the wound/dressing, or calf pain - CONTACT YOUR SURGEON.                                                   FOLLOW-UP APPOINTMENTS:  If you do not already have a post-op appointment, please call the office for an appointment to be seen by your surgeon.  Guidelines for how soon to be seen are listed in your "After Visit Summary", but are typically between 1-4 weeks after surgery.  OTHER INSTRUCTIONS:   Knee Replacement:  Do not place pillow under knee, focus on keeping the knee straight while resting. CPM instructions: 0-90 degrees, 2 hours in the morning, 2 hours in the afternoon, and 2 hours in the evening. Place foam block, curve side up under heel at all times except when in CPM or when walking.  DO NOT modify, tear, cut, or change the foam block in any way.  POST-OPERATIVE OPIOID TAPER INSTRUCTIONS: It is important to wean off of your opioid medication as soon as possible. If you do not need pain medication after your surgery it is ok to stop day one. Opioids include: Codeine, Hydrocodone(Norco, Vicodin), Oxycodone(Percocet, oxycontin) and hydromorphone amongst others.  Long term and even short term use of opiods can cause: Increased pain response Dependence Constipation Depression Respiratory depression And more.  Withdrawal symptoms can include Flu like symptoms Nausea, vomiting And more Techniques to manage these symptoms Hydrate well Eat regular healthy meals Stay active Use relaxation techniques(deep breathing, meditating, yoga) Do Not substitute Alcohol to help with tapering If you have been on opioids for less than two weeks and do not have pain than it is ok to stop all together.  Plan to wean off of opioids This plan should start within one week post op of your joint replacement. Maintain the same interval or time between taking each dose  and first decrease the dose.  Cut the total daily intake of opioids by one tablet each day Next start to increase the time between doses. The last dose that should be eliminated is the evening dose.   MAKE SURE YOU:  Understand these instructions.  Get help right away if you are not doing well or get worse.    Thank you for letting us be a part of your medical care team.  It is a privilege we respect greatly.  We hope these instructions will help you stay on track for a fast and full recovery!      Dental Antibiotics:  In most cases prophylactic antibiotics for Dental procdeures after total joint surgery are not necessary.  Exceptions are as follows:  1. History of prior total joint infection  2. Severely immunocompromised (Organ Transplant, cancer chemotherapy, Rheumatoid biologic meds such as Humera)  3. Poorly controlled diabetes (A1C &gt; 8.0, blood glucose over 200)  If you have one of these conditions, contact your surgeon for an antibiotic prescription, prior to your dental procedure.

## 2023-10-25 NOTE — Plan of Care (Signed)
  Problem: Activity: Goal: Risk for activity intolerance will decrease Outcome: Progressing   Problem: Nutrition: Goal: Adequate nutrition will be maintained Outcome: Progressing   Problem: Coping: Goal: Level of anxiety will decrease Outcome: Progressing   Problem: Elimination: Goal: Will not experience complications related to urinary retention Outcome: Progressing   Problem: Pain Managment: Goal: General experience of comfort will improve and/or be controlled Outcome: Progressing   Problem: Safety: Goal: Ability to remain free from injury will improve Outcome: Progressing

## 2023-10-25 NOTE — Progress Notes (Signed)
 Physical Therapy Treatment Patient Details Name: Candace Bailey MRN: [id number] DOB: [birth year] Today's Date: 10/25/2023   History of Present Illness Pt is a 53 year old female s/p L THA on 10/23/23.  PMHx: TBI in 1973, speech abnormality, left hemiplegia    PT Comments  Pt assisted with ambulating again this afternoon however requiring more assist likely due to fatigue.  Pt also with difficulty weight bearing on bil LEs basically relying on upper body and external physical assist when attempting to advance R LE.  Pt will continue to require acute physical therapy to assist with safely mobilizing and increasing independence in order to safely return home at discharge.     If plan is discharge home, recommend the following: Assistance with cooking/housework;Assist for transportation;Two people to help with walking and/or transfers;Two people to help with bathing/dressing/bathroom   Can travel by private vehicle        Equipment Recommendations  None recommended by PT    Recommendations for Other Services       Precautions / Restrictions Precautions Precautions: Fall Precaution/Restrictions Comments: L AFO, works better in shoes Restrictions Weight Bearing Restrictions Per Provider Order: No Other Position/Activity Restrictions: WBAT     Mobility  Bed Mobility               General bed mobility comments: pt sitting in transport chair on arrival    Transfers Overall transfer level: Needs assistance Equipment used: Bilateral platform walker Transfers: Sit to/from Stand Sit to Stand: +2 safety/equipment, Mod assist           General transfer comment: assist to rise and place UEs on platforms, pt able to assist with controlling descent once hands placed back to armrests    Ambulation/Gait Ambulation/Gait assistance: +2 physical assistance, Max assist Gait Distance (Feet): 10 Feet Assistive device: Bilateral platform walker Gait Pattern/deviations: Step-to pattern,  Knee flexed in stance - left, Narrow base of support, Decreased stance time - left, Decreased weight shift to left Gait velocity: decreased     General Gait Details: pt with difficulty with left knee extension however likely had some weakness present prior to surgery  (Mom reports knee flexion prior to surgery); cues for sequence and posture; pt tends to heavily rely on upper body support and in increased trunk flexion, limited weight bearing observed on left, narrow BOS (also present prior to surgery); pt's transport chair following closely for safety; pt with more difficulty advancing R LE as well as decreased R DF requiring max assist to hold pt while pt essentially not weighting on LEs (relying on UEs and physical assist)   Stairs             Wheelchair Mobility     Tilt Bed    Modified Rankin (Stroke Patients Only)       Balance                                            Communication Communication Communication: No apparent difficulties  Cognition Arousal: Alert Behavior During Therapy: WFL for tasks assessed/performed                           PT - Cognition Comments: hx TBI Following commands: Intact      Cueing Cueing Techniques: Verbal cues, Tactile cues  Exercises   General Comments  Pertinent Vitals/Pain Pain Assessment Pain Assessment: 0-10 Pain Score: 5  Pain Location: left hip Pain Descriptors / Indicators: Sore, Grimacing, Guarding Pain Intervention(s): Repositioned, Monitored during session    Home Living                          Prior Function            PT Goals (current goals can now be found in the care plan section) Progress towards PT goals: Progressing toward goals    Frequency    7X/week      PT Plan      Co-evaluation              AM-PAC PT "6 Clicks" Mobility   Outcome Measure  Help needed turning from your back to your side while in a flat bed without using  bedrails?: A Lot Help needed moving from lying on your back to sitting on the side of a flat bed without using bedrails?: A Lot Help needed moving to and from a bed to a chair (including a wheelchair)?: Total Help needed standing up from a chair using your arms (e.g., wheelchair or bedside chair)?: Total Help needed to walk in hospital room?: Total Help needed climbing 3-5 steps with a railing? : Total 6 Click Score: 8    End of Session Equipment Utilized During Treatment: Gait belt Activity Tolerance: Patient tolerated treatment well Patient left: in chair;with call bell/phone within reach;with family/visitor present (wished to remain in her transport chair) Nurse Communication: Mobility status PT Visit Diagnosis: Difficulty in walking, not elsewhere classified (R26.2);Unsteadiness on feet (R26.81)     Time: 1478-2956 PT Time Calculation (min) (ACUTE ONLY): 23 min  Charges:    $Gait Training: 23-37 mins  PT General Charges $$ ACUTE PT VISIT: 1 Visit                     Thomasene Mohair PT, DPT Physical Therapist Acute Rehabilitation Services Office: [phone]    [name] 10/25/2023, 3:19 PM

## 2023-10-25 NOTE — Progress Notes (Signed)
 Patient ID: Candace Bailey, female   DOB: [birth year], 53 y.o.   MRN: [id number] The patient is awake and alert in a bedside chair.  Her mother is at the bedside.  PT and OT are working with her during this hospitalization.  She is someone who has significant and profound limitations in her mobility and does have a history of a traumatic brain injury.  Apparently she is not a candidate for CIR.  We will need to maximize her therapy here for safety reasons prior to discharge to home with home health PT OT.

## 2023-10-26 NOTE — Progress Notes (Signed)
 Physical Therapy Treatment Patient Details Name: Candace Bailey MRN: [id number] DOB: January 27, 1971 Today's Date: 10/26/2023   History of Present Illness Pt is a 53 year old female s/p L THA on 10/23/23.  PMHx: TBI in 1973, speech abnormality, left hemiplegia    PT Comments  Pt performed sit to stand with her walker from home (upright rollator) but unable to ambulate with this walker as it only provides hand support, and pt has significantly relied on her forearms to support weight during ambulation.  Pt performed sit to stand again with bil platform RW and then able to progress ambulation to 22 ft this afternoon with min- CGA (which she has usually been requiring more assist in the afternoons likely from fatigue).  Family plans to provide supervision/set up support once pt discharges however pt needs to be able to mobilize without physical assist for safe return home.  Discussed pt likely needing bil platform RW upon d/c (since her upright rollator does not provide adequate upper body support at this time).  Pt making good progress today with possibility of discharge home in 1-2 days if progress continues.   If plan is discharge home, recommend the following: Assistance with cooking/housework;Assist for transportation;A little help with walking and/or transfers;A little help with bathing/dressing/bathroom   Can travel by private vehicle        Equipment Recommendations  Other (comment) (bil platform RW)    Recommendations for Other Services       Precautions / Restrictions Precautions Precautions: Fall Precaution/Restrictions Comments: L AFO, works better in shoes Restrictions Other Position/Activity Restrictions: WBAT     Mobility  Bed Mobility               General bed mobility comments: pt sitting in transport chair on arrival    Transfers Overall transfer level: Needs assistance Equipment used: Bilateral platform walker Transfers: Sit to/from Stand Sit to Stand: Contact  guard assist, +2 safety/equipment           General transfer comment: pt requires increased time but able to rise and place UEs on platforms as well as reach back to control descent; CGA for safety however no physical assist required    Ambulation/Gait Ambulation/Gait assistance: Contact guard assist, +2 safety/equipment, Min assist Gait Distance (Feet): 22 Feet Assistive device: Bilateral platform walker Gait Pattern/deviations: Step-to pattern, Knee flexed in stance - left, Narrow base of support, Decreased stance time - left, Decreased weight shift to left Gait velocity: decreased     General Gait Details: pt with difficulty with left knee extension however likely had some weakness present prior to surgery  (Mom reports knee flexion prior to surgery); cues for sequence and posture; pt tends to heavily rely on upper body support and in increased trunk flexion (likely as compensation strategy to advance LEs), limited weight bearing observed on left, narrow BOS (also present prior to surgery); pt's transport chair following closely for safety; decreased R DF observed   Stairs             Wheelchair Mobility     Tilt Bed    Modified Rankin (Stroke Patients Only)       Balance                                            Communication Communication Communication: No apparent difficulties  Cognition Arousal: Alert Behavior During Therapy: [facility] for  tasks assessed/performed                           PT - Cognition Comments: hx TBI Following commands: Intact      Cueing Cueing Techniques: Verbal cues, Tactile cues  Exercises      General Comments        Pertinent Vitals/Pain Pain Assessment Pain Assessment: 0-10 Pain Score: 3  Pain Location: left hip Pain Descriptors / Indicators: Sore, Grimacing, Guarding Pain Intervention(s): Monitored during session, Repositioned    Home Living                          Prior  Function            PT Goals (current goals can now be found in the care plan section) Progress towards PT goals: Progressing toward goals    Frequency    7X/week      PT Plan      Co-evaluation              AM-PAC PT "6 Clicks" Mobility   Outcome Measure  Help needed turning from your back to your side while in a flat bed without using bedrails?: A Little Help needed moving from lying on your back to sitting on the side of a flat bed without using bedrails?: A Little Help needed moving to and from a bed to a chair (including a wheelchair)?: A Little Help needed standing up from a chair using your arms (e.g., wheelchair or bedside chair)?: A Little Help needed to walk in hospital room?: A Lot Help needed climbing 3-5 steps with a railing? : Total 6 Click Score: 15    End of Session Equipment Utilized During Treatment: Gait belt Activity Tolerance: Patient tolerated treatment well Patient left: in chair;with call bell/phone within reach;with family/visitor present (wished to remain in her transport chair) Nurse Communication: Mobility status PT Visit Diagnosis: Difficulty in walking, not elsewhere classified (R26.2);Unsteadiness on feet (R26.81)     Time: 4098-1191 PT Time Calculation (min) (ACUTE ONLY): 30 min  Charges:    $Gait Training: 23-37 mins PT General Charges $$ ACUTE PT VISIT: 1 Visit                     Paulino Door, DPT Physical Therapist Acute Rehabilitation Services Office: [phone]    [name] 10/26/2023, 3:37 PM

## 2023-10-26 NOTE — Progress Notes (Signed)
 Patient ID: Candace Bailey, female   DOB: Jun 26, 1971, 53 y.o.   MRN: [id number] The patient is awake and alert in a bedside chair.  PT and OT are working with her during this hospitalization.  She is someone who has significant and profound limitations in her mobility and does have a history of a traumatic brain injury.   She will continue to work with physical therapy given the fact that she is not a candidate for inpatient rehab.  We will plan on continuing to have her work with physical therapy today to mobilize with continued assistance

## 2023-10-26 NOTE — Progress Notes (Signed)
 Physical Therapy Treatment Patient Details Name: Candace Bailey MRN: [id number] DOB: 02-Aug-1970 Today's Date: 10/26/2023   History of Present Illness Pt is a 53 year old female s/p L THA on 10/23/23.  PMHx: TBI in 1973, speech abnormality, left hemiplegia    PT Comments  Pt able to ambulate 17 ft in hallway with bil platform RW.  Pt also only required CGA for safety (also had her transport chair following).  Pt's family to bring in her walker from home to trial next session.     If plan is discharge home, recommend the following: Assistance with cooking/housework;Assist for transportation;A little help with walking and/or transfers;A little help with bathing/dressing/bathroom   Can travel by private vehicle        Equipment Recommendations  Other (comment) (bil platform RW)    Recommendations for Other Services       Precautions / Restrictions Precautions Precautions: Fall Precaution/Restrictions Comments: L AFO, works better in shoes Restrictions Other Position/Activity Restrictions: WBAT     Mobility  Bed Mobility               General bed mobility comments: pt sitting in transport chair on arrival    Transfers Overall transfer level: Needs assistance Equipment used: Bilateral platform walker Transfers: Sit to/from Stand Sit to Stand: Contact guard assist, +2 safety/equipment           General transfer comment: pt requires increased time but able to rise and place UEs on platforms as well as reach back to control descent; CGA for safety however no physical assist required    Ambulation/Gait Ambulation/Gait assistance: Contact guard assist, +2 safety/equipment Gait Distance (Feet): 17 Feet Assistive device: Bilateral platform walker Gait Pattern/deviations: Step-to pattern, Knee flexed in stance - left, Narrow base of support, Decreased stance time - left, Decreased weight shift to left Gait velocity: decreased     General Gait Details: pt with difficulty  with left knee extension however likely had some weakness present prior to surgery  (Mom reports knee flexion prior to surgery); cues for sequence and posture; pt tends to heavily rely on upper body support and in increased trunk flexion (likely as compensation strategy to advance LEs), limited weight bearing observed on left, narrow BOS (also present prior to surgery); pt's transport chair following closely for safety; decreased R DF observed   Stairs             Wheelchair Mobility     Tilt Bed    Modified Rankin (Stroke Patients Only)       Balance                                            Communication Communication Communication: No apparent difficulties  Cognition Arousal: Alert Behavior During Therapy: WFL for tasks assessed/performed                           PT - Cognition Comments: hx TBI Following commands: Intact      Cueing Cueing Techniques: Verbal cues, Tactile cues  Exercises      General Comments        Pertinent Vitals/Pain Pain Assessment Pain Assessment: 0-10 Pain Score: 3  Pain Location: left hip Pain Descriptors / Indicators: Sore, Grimacing, Guarding Pain Intervention(s): Monitored during session, Repositioned    Home Living  Prior Function            PT Goals (current goals can now be found in the care plan section) Progress towards PT goals: Progressing toward goals    Frequency    7X/week      PT Plan      Co-evaluation              AM-PAC PT "6 Clicks" Mobility   Outcome Measure  Help needed turning from your back to your side while in a flat bed without using bedrails?: A Little Help needed moving from lying on your back to sitting on the side of a flat bed without using bedrails?: A Little Help needed moving to and from a bed to a chair (including a wheelchair)?: A Little Help needed standing up from a chair using your arms (e.g.,  wheelchair or bedside chair)?: A Little Help needed to walk in hospital room?: A Lot Help needed climbing 3-5 steps with a railing? : Total 6 Click Score: 15    End of Session Equipment Utilized During Treatment: Gait belt Activity Tolerance: Patient tolerated treatment well Patient left: in chair;with call bell/phone within reach;with family/visitor present (wished to remain in her transport chair) Nurse Communication: Mobility status PT Visit Diagnosis: Difficulty in walking, not elsewhere classified (R26.2);Unsteadiness on feet (R26.81)     Time: 1610-9604 PT Time Calculation (min) (ACUTE ONLY): 19 min  Charges:    $Gait Training: 8-22 mins PT General Charges $$ ACUTE PT VISIT: 1 Visit                     Paulino Door, DPT Physical Therapist Acute Rehabilitation Services Office: [phone]    Candace Bailey Payson 10/26/2023, 3:32 PM

## 2023-10-26 NOTE — Plan of Care (Signed)
  Problem: Coping: ?Goal: Level of anxiety will decrease ?Outcome: Progressing ?  ?Problem: Safety: ?Goal: Ability to remain free from injury will improve ?Outcome: Progressing ?  ?Problem: Pain Management: ?Goal: Pain level will decrease with appropriate interventions ?Outcome: Progressing ?  ?

## 2023-10-26 NOTE — Plan of Care (Signed)

## 2023-10-27 ENCOUNTER — Telehealth: Payer: Self-pay | Admitting: Orthopaedic Surgery

## 2023-10-27 MED ORDER — OXYCODONE HCL 5 MG PO TABS: 5.0000 mg | ORAL_TABLET | Freq: Four times a day (QID) | ORAL | 0 refills | Status: DC | PRN

## 2023-10-27 MED ORDER — METHOCARBAMOL 500 MG PO TABS: 500.0000 mg | ORAL_TABLET | Freq: Four times a day (QID) | ORAL | 1 refills | Status: DC | PRN

## 2023-10-27 MED ORDER — ASPIRIN 81 MG PO CHEW: 81.0000 mg | CHEWABLE_TABLET | Freq: Two times a day (BID) | ORAL | 0 refills | Status: AC

## 2023-10-27 MED ORDER — ASPIRIN 81 MG PO CHEW: 81.0000 mg | CHEWABLE_TABLET | Freq: Two times a day (BID) | ORAL | 0 refills | Status: DC

## 2023-10-27 NOTE — Progress Notes (Signed)
 Physical Therapy Treatment Patient Details Name: Candace Bailey MRN: [id number] DOB: [birth year] Today's Date: 10/27/2023   History of Present Illness Pt is a 53 year old female s/p L THA on 10/23/23.  PMHx: TBI in 1973, speech abnormality, left hemiplegia    PT Comments  Pt's Mom and sister present during session.  Very encouraging/helpful.  Assisted Pt with amb in hallway.  General transfer comment: pt requires increased time but able to rise and place UEs on platforms as well as reach back to control descent; CGA for safety however no physical assist required.  General Gait Details: tolerated an increased distance of 22 feet then another 20 feet using B platform RW and a piece of Thera Band around Pt's L ankle secured to L walker leg to increase L ABd during swing phase of gait. Personal wc following for safety for Pt to sit when fatigued. Pt will need ST Rehab at SNF to address mobility and functional decline prior to safely returning home.    If plan is discharge home, recommend the following: Assistance with cooking/housework;Assist for transportation;A little help with walking and/or transfers;A little help with bathing/dressing/bathroom   Can travel by private vehicle        Equipment Recommendations       Recommendations for Other Services       Precautions / Restrictions Precautions Precautions: Fall Precaution/Restrictions Comments: L AFO, works better in shoes Restrictions Weight Bearing Restrictions Per Provider Order: No Other Position/Activity Restrictions: WBAT     Mobility  Bed Mobility               General bed mobility comments: Pt OOB in her personal wc    Transfers Overall transfer level: Needs assistance Equipment used: Bilateral platform walker Transfers: Sit to/from Stand Sit to Stand: Contact guard assist, +2 safety/equipment           General transfer comment: pt requires increased time but able to rise and place UEs on platforms as well as  reach back to control descent; CGA for safety however no physical assist required    Ambulation/Gait Ambulation/Gait assistance: Contact guard assist, +2 safety/equipment, Min assist Gait Distance (Feet): 42 Feet Assistive device: Bilateral platform walker Gait Pattern/deviations: Step-to pattern, Knee flexed in stance - left, Narrow base of support, Decreased stance time - left, Decreased weight shift to left Gait velocity: decreased     General Gait Details: tolerated an increased distance of 22 feet then another 20 feet using B platform RW and a piece of Thera Band around Pt's L ankle secured to L walker leg to increase L ABd during swing phase of gait. Personal wc following for safety for Pt to sit when fatigued.   Stairs             Wheelchair Mobility     Tilt Bed    Modified Rankin (Stroke Patients Only)       Balance                                            Communication Communication Communication: No apparent difficulties Factors Affecting Communication: Reduced clarity of speech  Cognition Arousal: Alert Behavior During Therapy: WFL for tasks assessed/performed                           PT - Cognition Comments: AxO x 3  pleasant and following all directions.  Hx TBI with speech difficulty but highly functioning. Following commands: Intact      Cueing Cueing Techniques: Verbal cues  Exercises      General Comments        Pertinent Vitals/Pain Pain Assessment Pain Assessment: Faces Faces Pain Scale: Hurts a little bit Pain Location: left hip Pain Descriptors / Indicators: Sore, Grimacing, Guarding, Operative site guarding Pain Intervention(s): Monitored during session, Repositioned    Home Living                          Prior Function            PT Goals (current goals can now be found in the care plan section) Progress towards PT goals: Progressing toward goals    Frequency     7X/week      PT Plan      Co-evaluation              AM-PAC PT "6 Clicks" Mobility   Outcome Measure  Help needed turning from your back to your side while in a flat bed without using bedrails?: A Lot   Help needed moving to and from a bed to a chair (including a wheelchair)?: A Lot Help needed standing up from a chair using your arms (e.g., wheelchair or bedside chair)?: A Lot Help needed to walk in hospital room?: A Lot Help needed climbing 3-5 steps with a railing? : Total 6 Click Score: 9    End of Session Equipment Utilized During Treatment: Gait belt Activity Tolerance: Patient tolerated treatment well Patient left: in chair;with call bell/phone within reach;with family/visitor present Nurse Communication: Mobility status PT Visit Diagnosis: Difficulty in walking, not elsewhere classified (R26.2);Unsteadiness on feet (R26.81)     Time: 7829-5621 PT Time Calculation (min) (ACUTE ONLY): 15 min  Charges:    $Gait Training: 8-22 mins PT General Charges $$ ACUTE PT VISIT: 1 Visit                     Felecia Shelling  PTA Acute  Rehabilitation Services Office M-F          [phone]

## 2023-10-27 NOTE — Progress Notes (Signed)
 Patient ID: Candace Bailey, female   DOB: [birth year], 53 y.o.   MRN: [id number] Physical therapy is recommending short-term skilled nursing placement for the patient following this hospitalization.  Her mother agrees with this as well.  I will put an order for the transitional care team/social worker for this and will need an FL 2 placed within her epic chart for me to sign.  From an orthopedic standpoint, she can be discharged as soon as tomorrow.  We will need to put physical prescriptions on her chart at the end of the day today versus tomorrow.

## 2023-10-27 NOTE — TOC PASRR Note (Signed)
 30 Day PASRR Note   Patient Details  Name: Candace Bailey Date of Birth: [birth year]   Transition of Care [facility]) CM/SW Contact:    Howell Rucks, RN Phone Number: 10/27/2023, 12:57 PM  To Whom It May Concern:  Please be advised that this patient will require a short-term nursing home stay - anticipated 30 days or less for rehabilitation and strengthening.   The plan is for return home.

## 2023-10-27 NOTE — Plan of Care (Signed)

## 2023-10-27 NOTE — NC FL2 (Signed)
 [place] MEDICAID FL2 LEVEL OF CARE FORM     IDENTIFICATION  Patient Name: Candace Bailey Birthdate: January 15, 1971 Sex: female Admission Date (Current Location): 10/23/2023  [facility] and [place] Number:  Producer, television/film/video and Address:  [facility],  [address], [place] [address]      Provider Number: [id number]  Attending Physician Name and Address:  Kathryne Hitch,*  Relative Name and Phone Number:  Buckbee,Mary (Mother)  [phone] [facility])    Current Level of Care: Hospital Recommended Level of Care: Skilled Nursing Facility Prior Approval Number:    Date Approved/Denied:   PASRR Number: pending  Discharge Plan: SNF    Current Diagnoses: Patient Active Problem List   Diagnosis Date Noted   Status post total replacement of left hip 10/23/2023   Unilateral primary osteoarthritis, left hip 09/17/2023   Abnormal TSH 08/05/2011   C. difficile colitis 08/01/2011   Dehydration 08/01/2011   TBI (traumatic brain injury) (HCC) 07/31/2011    Orientation RESPIRATION BLADDER Height & Weight     Self, Time, Situation, Place  Normal Incontinent, External catheter Weight: 59 kg Height:  4\' 11"  (149.9 cm)  BEHAVIORAL SYMPTOMS/MOOD NEUROLOGICAL BOWEL NUTRITION STATUS      Continent Diet (regular)  AMBULATORY STATUS COMMUNICATION OF NEEDS Skin   Limited Assist Verbally Normal                       Personal Care Assistance Level of Assistance  Bathing, Feeding, Dressing Bathing Assistance: Limited assistance Feeding assistance: Limited assistance Dressing Assistance: Limited assistance     Functional Limitations Info  Sight, Hearing, Speech Sight Info: Impaired Hearing Info: Adequate Speech Info: Impaired    SPECIAL CARE FACTORS FREQUENCY  PT (By licensed PT), OT (By licensed OT)     PT Frequency: 5x/wk OT Frequency: 5x/wk            Contractures Contractures Info: Not present    Additional Factors Info  Code Status,  Allergies, Psychotropic Code Status Info: Full Code Allergies Info: Latex Psychotropic Info: N/A         Current Medications (10/27/2023):  This is the current hospital active medication list Current Facility-Administered Medications  Medication Dose Route Frequency Provider Last Rate Last Admin   0.9 %  sodium chloride infusion   Intravenous Continuous Kathryne Hitch, MD   Stopped at 10/25/23 1337   acetaminophen (TYLENOL) tablet 325-650 mg  325-650 mg Oral Q6H PRN Kathryne Hitch, MD   650 mg at 10/26/23 2249   alum & mag hydroxide-simeth (MAALOX/MYLANTA) 200-200-20 MG/5ML suspension 30 mL  30 mL Oral Q4H PRN Kathryne Hitch, MD       aspirin chewable tablet 81 mg  81 mg Oral BID Kathryne Hitch, MD   81 mg at 10/27/23 5621   diphenhydrAMINE (BENADRYL) 12.5 MG/5ML elixir 12.5-25 mg  12.5-25 mg Oral Q4H PRN Kathryne Hitch, MD       docusate sodium (COLACE) capsule 100 mg  100 mg Oral BID Kathryne Hitch, MD   100 mg at 10/27/23 3086   HYDROmorphone (DILAUDID) injection 0.5-1 mg  0.5-1 mg Intravenous Q4H PRN Kathryne Hitch, MD       menthol-cetylpyridinium (CEPACOL) lozenge 3 mg  1 lozenge Oral PRN Kathryne Hitch, MD       Or   phenol (CHLORASEPTIC) mouth spray 1 spray  1 spray Mouth/Throat PRN Kathryne Hitch, MD       methocarbamol (ROBAXIN) tablet  500 mg  500 mg Oral Q6H PRN Kathryne Hitch, MD   500 mg at 10/24/23 5366   Or   methocarbamol (ROBAXIN) injection 500 mg  500 mg Intravenous Q6H PRN Kathryne Hitch, MD       metoCLOPramide (REGLAN) tablet 5-10 mg  5-10 mg Oral Q8H PRN Kathryne Hitch, MD       Or   metoCLOPramide (REGLAN) injection 5-10 mg  5-10 mg Intravenous Q8H PRN Kathryne Hitch, MD       ondansetron [facility]) tablet 4 mg  4 mg Oral Q6H PRN Kathryne Hitch, MD       Or   ondansetron [facility]) injection 4 mg  4 mg Intravenous Q6H PRN Kathryne Hitch,  MD       oxyCODONE (Oxy IR/ROXICODONE) immediate release tablet 10-15 mg  10-15 mg Oral Q4H PRN Kathryne Hitch, MD       oxyCODONE (Oxy IR/ROXICODONE) immediate release tablet 5-10 mg  5-10 mg Oral Q4H PRN Kathryne Hitch, MD   5 mg at 10/26/23 2357   pantoprazole (PROTONIX) EC tablet 40 mg  40 mg Oral Daily Kathryne Hitch, MD   40 mg at 10/27/23 4403     Discharge Medications: Please see discharge summary for a list of discharge medications.  Relevant Imaging Results:  Relevant Lab Results:   Additional Information SSN: [id number]  Howell Rucks, RN

## 2023-10-27 NOTE — TOC Progression Note (Addendum)
 Transition of Care [facility]) - Progression Note    Patient Details  Name: Candace Bailey MRN: [id number] Date of Birth: 1971/06/19  Transition of Care [facility]) CM/SW Contact  Howell Rucks, RN Phone Number: 10/27/2023, 10:59 AM  Clinical Narrative:   Teams chat received from bedside nurse, pt/family requesting patient to go to SNF, Dr Magnus Ivan included in chat, await response.   -1:07pm TOC consult received from SNF Placement. PT recommendation for SNF. FL2 updated, Level 2 PASRR pending. Faxed out for bed offers, mother prefers [facility].   -1:59pm [facility], rep-Whitney, reports facility does no accept [facility]. Family reports their father was told by insurance that [facility] is a contracted facility with [employer], family request facility to run insurance, Alphonzo Lemmings reports she will run it when she has time. Await bed offers.   -3:21pm Met with pt and sister at bedside reports they confirmed [facility] is not a contracted facility, await bed offers.     Expected Discharge Plan: IP Rehab Facility Barriers to Discharge: Continued Medical Work up  Expected Discharge Plan and Services       Living arrangements for the past 2 months: Apartment                                       Social Determinants of Health (SDOH) Interventions SDOH Screenings   Food Insecurity: No Food Insecurity (10/23/2023)  Housing: Low Risk  (10/23/2023)  Transportation Needs: No Transportation Needs (10/23/2023)  Utilities: Not At Risk (10/23/2023)  Depression (PHQ2-9): Low Risk  (12/04/2020)  Tobacco Use: Low Risk  (10/23/2023)    Readmission Risk Interventions     No data to display

## 2023-10-27 NOTE — Progress Notes (Addendum)
 Patient ID: Candace Bailey, female   DOB: 1971/07/01, 53 y.o.   MRN: [id number] The patient is awake and alert.  Her left operative hip is stable.  The dressing is clean and dry.  She is does state that she hopes to go home soon.  I did review the notes from her afternoon PT session yesterday.  It sounds like she is making good progress and they felt comfortable with having her go home in 1 to 2 days.  We will see how she does today.  There is potential for discharge home this afternoon versus more likely tomorrow.  Will continue to maximize therapy while she is here.  If for some reason she does very well this afternoon, please let me know and we will put in her discharge orders.

## 2023-10-27 NOTE — Plan of Care (Signed)
   Problem: Coping: Goal: Level of anxiety will decrease Outcome: Progressing   Problem: Pain Managment: Goal: General experience of comfort will improve and/or be controlled Outcome: Progressing   Problem: Safety: Goal: Ability to remain free from injury will improve Outcome: Progressing

## 2023-10-27 NOTE — Progress Notes (Signed)
 Physical Therapy Treatment Patient Details Name: Candace Bailey MRN: [id number] DOB: Apr 12, 1971 Today's Date: 10/27/2023   History of Present Illness Pt is a 53 year old female s/p L THA on 10/23/23.  PMHx: TBI in 1973, speech abnormality, left hemiplegia    PT Comments  POD # 4 Since Pt does not qualify for AIR,  Now rec ST Rehab at SNF. Prior to surgery, Pt was Indep amb.  Currently, Pt requires much assist.  General Gait Details: pt with difficulty with left knee extension however likely had some weakness present prior to surgery  (Mom reports knee flexion prior to surgery); cues for sequence and posture; pt tends to heavily rely on upper body support and in increased trunk flexion (likely as compensation strategy to advance LEs), limited weight bearing observed on left, narrow BOS (also present prior to surgery); pt's transport chair following closely for safety; decreased R DF observed.  Used a piece of Thera Band around Pt's L ankle and secured to L lateral walker to facititate increased L ABDuction during stance phase. Tolerated amb 6 times to total 32 feet. (5+5+6+7+4+ 5 feet) seated rest break inbetween. Gait remains unsteady and limited.  HIGH FALL RISK.  HIGH Re Admit risk.     If plan is discharge home, recommend the following: Assistance with cooking/housework;Assist for transportation;A little help with walking and/or transfers;A little help with bathing/dressing/bathroom   Can travel by private vehicle     No  Equipment Recommendations  Other (comment) (B platforms)    Recommendations for Other Services       Precautions / Restrictions Precautions Precautions: Fall Precaution/Restrictions Comments: L AFO, works better in Archivist Weight Bearing Restrictions Per Provider Order: No Other Position/Activity Restrictions: WBAT     Mobility  Bed Mobility               General bed mobility comments: Pt OOB in recliner    Transfers Overall transfer level:  Needs assistance Equipment used: Bilateral platform walker Transfers: Sit to/from Stand Sit to Stand: Contact guard assist, +2 safety/equipment           General transfer comment: pt requires increased time but able to rise and place UEs on platforms as well as reach back to control descent; CGA for safety however no physical assist required    Ambulation/Gait Ambulation/Gait assistance: Contact guard assist, +2 safety/equipment, Min assist Gait Distance (Feet): 32 Feet Assistive device: Bilateral platform walker Gait Pattern/deviations: Step-to pattern, Knee flexed in stance - left, Narrow base of support, Decreased stance time - left, Decreased weight shift to left Gait velocity: decreased     General Gait Details: pt with difficulty with left knee extension however likely had some weakness present prior to surgery  (Mom reports knee flexion prior to surgery); cues for sequence and posture; pt tends to heavily rely on upper body support and in increased trunk flexion (likely as compensation strategy to advance LEs), limited weight bearing observed on left, narrow BOS (also present prior to surgery); pt's transport chair following closely for safety; decreased R DF observed.  Used a piece of Thera Band around Pt's L ankle and secured to L lateral walker to facititate increased L ABDuction during stance phase. Tolerated amb 6 times to total 32 feet. (5+5+6+7+4+ 5 feet) seated rest break inbetween.   Stairs             Wheelchair Mobility     Tilt Bed    Modified Rankin (Stroke Patients Only)  Balance                                            Communication Communication Factors Affecting Communication: Reduced clarity of speech  Cognition Arousal: Alert Behavior During Therapy: WFL for tasks assessed/performed                           PT - Cognition Comments: AxO x 3 pleasant and following all directions.  Hx TBI with speech  difficulty but highly functioning. Following commands: Intact      Cueing Cueing Techniques: Verbal cues  Exercises      General Comments        Pertinent Vitals/Pain Pain Assessment Pain Assessment: Faces Faces Pain Scale: Hurts little more Pain Location: left hip Pain Descriptors / Indicators: Sore, Grimacing, Guarding, Operative site guarding Pain Intervention(s): Monitored during session, Repositioned    Home Living                          Prior Function            PT Goals (current goals can now be found in the care plan section) Progress towards PT goals: Progressing toward goals    Frequency           PT Plan      Co-evaluation              AM-PAC PT "6 Clicks" Mobility   Outcome Measure  Help needed turning from your back to your side while in a flat bed without using bedrails?: A Lot Help needed moving from lying on your back to sitting on the side of a flat bed without using bedrails?: A Lot Help needed moving to and from a bed to a chair (including a wheelchair)?: A Lot Help needed standing up from a chair using your arms (e.g., wheelchair or bedside chair)?: A Lot Help needed to walk in hospital room?: A Lot Help needed climbing 3-5 steps with a railing? : Total 6 Click Score: 11    End of Session Equipment Utilized During Treatment: Gait belt Activity Tolerance: Patient tolerated treatment well Patient left: in chair;with call bell/phone within reach;with family/visitor present Nurse Communication: Mobility status PT Visit Diagnosis: Difficulty in walking, not elsewhere classified (R26.2);Unsteadiness on feet (R26.81)     Time: 1610-9604 PT Time Calculation (min) (ACUTE ONLY): 40 min  Charges:    $Gait Training: 23-37 mins $Therapeutic Activity: 8-22 mins                       Felecia Shelling  PTA Acute  Rehabilitation Services Office M-F          [phone]

## 2023-10-27 NOTE — Telephone Encounter (Signed)
 Pt called and state she is at [facility] ED and has some medical questions. Please call pt at [phone].

## 2023-10-28 MED ORDER — POLYETHYLENE GLYCOL 3350 17 G PO PACK: 17.0000 g | PACK | Freq: Every day | ORAL | Status: DC | PRN

## 2023-10-28 MED ORDER — BISACODYL 10 MG RE SUPP: 10.0000 mg | Freq: Every day | RECTAL | Status: DC | PRN

## 2023-10-28 MED ORDER — SENNOSIDES-DOCUSATE SODIUM 8.6-50 MG PO TABS: 1.0000 | ORAL_TABLET | Freq: Once | ORAL | Status: DC

## 2023-10-28 NOTE — Progress Notes (Signed)
 Patient ID: Candace Bailey, female   DOB: May 29, 1971, 53 y.o.   MRN: [id number] The patient and her family are weighing the choices for short-term skilled nursing.  She did work with therapy today and will continue to work with therapy while she is in the hospital.

## 2023-10-28 NOTE — Progress Notes (Signed)
 Patient ID: Candace Bailey, female   DOB: 1971-03-24, 53 y.o.   MRN: [id number] If short-term skilled nursing facility bed is found today, the patient can be discharged.  I will go ahead and put in discharge orders and a charge summary just in case.
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
## 2023-10-28 NOTE — NC FL2 (Signed)
 [place] MEDICAID FL2 LEVEL OF CARE FORM     IDENTIFICATION  Patient Name: Candace Bailey Birthdate: 1970/11/23 Sex: female Admission Date (Current Location): 10/23/2023  [facility] and [place] Number:  Producer, television/film/video and Address:  [facility],  [address], [place] [address]      Provider Number: [id number]  Attending Physician Name and Address:  Kathryne Hitch,*  Relative Name and Phone Number:  Hinckley,Mary (Mother)  [phone] [facility])    Current Level of Care: Hospital Recommended Level of Care: Skilled Nursing Facility Prior Approval Number:    Date Approved/Denied:   PASRR Number: [id number]  Discharge Plan: SNF    Current Diagnoses: Patient Active Problem List   Diagnosis Date Noted   Status post total replacement of left hip 10/23/2023   Unilateral primary osteoarthritis, left hip 09/17/2023   Abnormal TSH 08/05/2011   C. difficile colitis 08/01/2011   Dehydration 08/01/2011   TBI (traumatic brain injury) (HCC) 07/31/2011    Orientation RESPIRATION BLADDER Height & Weight     Self, Time, Situation, Place  Normal Incontinent, External catheter Weight: 130 lb (59 kg) Height:  4\' 11"  (149.9 cm)  BEHAVIORAL SYMPTOMS/MOOD NEUROLOGICAL BOWEL NUTRITION STATUS      Continent Diet (Regular)  AMBULATORY STATUS COMMUNICATION OF NEEDS Skin   Limited Assist Verbally Normal                       Personal Care Assistance Level of Assistance  Bathing, Feeding, Dressing Bathing Assistance: Limited assistance Feeding assistance: Limited assistance Dressing Assistance: Limited assistance     Functional Limitations Info  Sight, Hearing, Speech Sight Info: Impaired Hearing Info: Adequate Speech Info: Impaired    SPECIAL CARE FACTORS FREQUENCY  PT (By licensed PT), OT (By licensed OT)     PT Frequency: 5x/wk OT Frequency: 5x/wk            Contractures Contractures Info: Not present    Additional Factors Info  Code  Status, Allergies, Psychotropic Code Status Info: Full Code Allergies Info: Latex Psychotropic Info: N/A         Current Medications (10/28/2023):  This is the current hospital active medication list Current Facility-Administered Medications  Medication Dose Route Frequency Provider Last Rate Last Admin   0.9 %  sodium chloride infusion   Intravenous Continuous Kathryne Hitch, MD   Stopped at 10/25/23 1337   acetaminophen (TYLENOL) tablet 325-650 mg  325-650 mg Oral Q6H PRN Kathryne Hitch, MD   650 mg at 10/27/23 2321   alum & mag hydroxide-simeth (MAALOX/MYLANTA) 200-200-20 MG/5ML suspension 30 mL  30 mL Oral Q4H PRN Kathryne Hitch, MD       aspirin chewable tablet 81 mg  81 mg Oral BID Kathryne Hitch, MD   81 mg at 10/28/23 3664   diphenhydrAMINE (BENADRYL) 12.5 MG/5ML elixir 12.5-25 mg  12.5-25 mg Oral Q4H PRN Kathryne Hitch, MD       docusate sodium (COLACE) capsule 100 mg  100 mg Oral BID Kathryne Hitch, MD   100 mg at 10/28/23 4034   HYDROmorphone (DILAUDID) injection 0.5-1 mg  0.5-1 mg Intravenous Q4H PRN Kathryne Hitch, MD       menthol-cetylpyridinium (CEPACOL) lozenge 3 mg  1 lozenge Oral PRN Kathryne Hitch, MD       Or   phenol (CHLORASEPTIC) mouth spray 1 spray  1 spray Mouth/Throat PRN Kathryne Hitch, MD       methocarbamol (  ROBAXIN) tablet 500 mg  500 mg Oral Q6H PRN Kathryne Hitch, MD   500 mg at 10/24/23 9147   Or   methocarbamol (ROBAXIN) injection 500 mg  500 mg Intravenous Q6H PRN Kathryne Hitch, MD       metoCLOPramide (REGLAN) tablet 5-10 mg  5-10 mg Oral Q8H PRN Kathryne Hitch, MD       Or   metoCLOPramide (REGLAN) injection 5-10 mg  5-10 mg Intravenous Q8H PRN Kathryne Hitch, MD       ondansetron [facility]) tablet 4 mg  4 mg Oral Q6H PRN Kathryne Hitch, MD       Or   ondansetron [facility]) injection 4 mg  4 mg Intravenous Q6H PRN Kathryne Hitch, MD       oxyCODONE (Oxy IR/ROXICODONE) immediate release tablet 10-15 mg  10-15 mg Oral Q4H PRN Kathryne Hitch, MD       oxyCODONE (Oxy IR/ROXICODONE) immediate release tablet 5-10 mg  5-10 mg Oral Q4H PRN Kathryne Hitch, MD   5 mg at 10/26/23 2357   pantoprazole (PROTONIX) EC tablet 40 mg  40 mg Oral Daily Kathryne Hitch, MD   40 mg at 10/28/23 8295     Discharge Medications: Please see discharge summary for a list of discharge medications.  Relevant Imaging Results:  Relevant Lab Results:   Additional Information SSN [id number]  Diona Browner, LCSW
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
## 2023-10-28 NOTE — Plan of Care (Signed)
  Problem: Safety: Goal: Ability to remain free from injury will improve Outcome: Progressing   Problem: Pain Managment: Goal: General experience of comfort will improve and/or be controlled Outcome: Progressing   Problem: Elimination: Goal: Will not experience complications related to urinary retention Outcome: Progressing

## 2023-10-28 NOTE — TOC Progression Note (Signed)
 Transition of Care [facility]) - Progression Note    Patient Details  Name: Candace Bailey MRN: [id number] Date of Birth: 1971-06-02  [facility] CM/SW Contact  Diona Browner, LCSW Phone Number: 10/28/2023, 1:19 PM  Clinical Narrative:    CSW presented bed offers to pt and family. Family wishes to visit facilities prior to making decision. Family stated they would be looking at facilities today. TOC to follow up on bed choice.    Expected Discharge Plan: IP Rehab Facility Barriers to Discharge: Continued Medical Work up  Expected Discharge Plan and Services       Living arrangements for the past 2 months: Apartment Expected Discharge Date: 10/28/23                                     Social Determinants of Health (SDOH) Interventions SDOH Screenings   Food Insecurity: No Food Insecurity (10/23/2023)  Housing: Low Risk  (10/23/2023)  Transportation Needs: No Transportation Needs (10/23/2023)  Utilities: Not At Risk (10/23/2023)  Depression (PHQ2-9): Low Risk  (12/04/2020)  Tobacco Use: Low Risk  (10/23/2023)    Readmission Risk Interventions     No data to display

## 2023-10-28 NOTE — Progress Notes (Signed)
 Physical Therapy Treatment Patient Details Name: Candace Bailey MRN: [id number] DOB: [birth year] Today's Date: 10/28/2023   History of Present Illness Pt is a 53 year old female s/p L THA on 10/23/23.  PMHx: TBI in 1973, speech abnormality, left hemiplegia    PT Comments  POD # 5 pm session Pt OOB in her personal wc.  Sister in room.  Mom out looking at Rehab facilities.  Told sister to take Pt's personal Rollator home as [facility] will not take with them.  I forgot about mentioning her personal wheelchair but uses that when OOB. Assisted with amb in hallway continues to show progress.  Pt very motivated to get "back to me" and be able to amb by herself. Pt will need ST Rehab at SNF to address mobility and functional decline prior to safely returning home.    If plan is discharge home, recommend the following: Assistance with cooking/housework;Assist for transportation;A little help with walking and/or transfers;A little help with bathing/dressing/bathroom   Can travel by private vehicle     No  Equipment Recommendations  Other (comment) (B Platform)    Recommendations for Other Services       Precautions / Restrictions Precautions Precautions: None Precaution/Restrictions Comments: L AFO, works better in shoes, HX TBI L Hemi Restrictions Weight Bearing Restrictions Per Provider Order: No Other Position/Activity Restrictions: WBAT     Mobility  Bed Mobility               General bed mobility comments: Pt OOB in her personal wc    Transfers Overall transfer level: Needs assistance Equipment used: Bilateral platform walker Transfers: Sit to/from Stand Sit to Stand: Contact guard assist, +2 safety/equipment           General transfer comment: pt requires increased time but able to rise and place UEs on platforms as well as reach back to control descent; CGA for safety however no physical assist required    Ambulation/Gait Ambulation/Gait assistance: Contact guard assist,  +2 safety/equipment, Min assist Gait Distance (Feet): 38 Feet Assistive device: Bilateral platform walker Gait Pattern/deviations: Step-to pattern, Knee flexed in stance - left, Narrow base of support, Decreased stance time - left, Decreased weight shift to left Gait velocity: decreased     General Gait Details: tolerated an increased distance of 24 feet then another 14 feet using B platform RW and a piece of Thera Band around Pt's L ankle secured to L walker leg to increase L ABd during swing phase of gait. Personal wc following for safety for Pt to sit when fatigued.  Excessive lean on B platform.  Attempted her regular rollator walker however unable to even take one step due to L LE instability/knee bucckle.   Stairs             Wheelchair Mobility     Tilt Bed    Modified Rankin (Stroke Patients Only)       Balance                                            Communication Communication Communication: No apparent difficulties Factors Affecting Communication: Reduced clarity of speech  Cognition Arousal: Alert Behavior During Therapy: WFL for tasks assessed/performed                           PT - Cognition Comments: AxO  x 3 pleasant and following all directions.  Hx TBI with speech difficulty but highly functioning. Funny. Following commands: Intact      Cueing Cueing Techniques: Verbal cues  Exercises      General Comments General comments (skin integrity, edema, etc.): L hip incision covered with post op dressing and intact      Pertinent Vitals/Pain Pain Assessment Pain Assessment: Faces Faces Pain Scale: Hurts a little bit Pain Location: left hip, "not bad" Pain Descriptors / Indicators: Sore, Grimacing, Guarding, Operative site guarding Pain Intervention(s): Monitored during session, Repositioned    Home Living                          Prior Function            PT Goals (current goals can now be found in  the care plan section) Progress towards PT goals: Progressing toward goals    Frequency    7X/week      PT Plan      Co-evaluation              AM-PAC PT "6 Clicks" Mobility   Outcome Measure  Help needed turning from your back to your side while in a flat bed without using bedrails?: A Lot Help needed moving from lying on your back to sitting on the side of a flat bed without using bedrails?: A Lot Help needed moving to and from a bed to a chair (including a wheelchair)?: A Lot Help needed standing up from a chair using your arms (e.g., wheelchair or bedside chair)?: A Lot Help needed to walk in hospital room?: A Lot Help needed climbing 3-5 steps with a railing? : Total 6 Click Score: 11    End of Session Equipment Utilized During Treatment: Gait belt Activity Tolerance: Patient tolerated treatment well Patient left: in chair;with call bell/phone within reach;with family/visitor present Nurse Communication: Mobility status PT Visit Diagnosis: Difficulty in walking, not elsewhere classified (R26.2);Unsteadiness on feet (R26.81)     Time: 2130-8657 PT Time Calculation (min) (ACUTE ONLY): 26 min  Charges:    $Gait Training: 8-22 mins $Therapeutic Activity: 8-22 mins PT General Charges $$ ACUTE PT VISIT: 1 Visit                    Felecia Shelling  PTA Acute  Rehabilitation Services Office M-F          [phone]

## 2023-10-28 NOTE — Plan of Care (Signed)
?  Problem: Education: ?Goal: Knowledge of General Education information will improve ?Description: Including pain rating scale, medication(s)/side effects and non-pharmacologic comfort measures ?Outcome: Progressing ?  ?Problem: Health Behavior/Discharge Planning: ?Goal: Ability to manage health-related needs will improve ?Outcome: Progressing ?  ?Problem: Clinical Measurements: ?Goal: Will remain free from infection ?Outcome: Progressing ?Goal: Cardiovascular complication will be avoided ?Outcome: Progressing ?  ?Problem: Activity: ?Goal: Risk for activity intolerance will decrease ?Outcome: Progressing ?  ?

## 2023-10-29 NOTE — TOC Progression Note (Addendum)
 Transition of Care [facility]) - Progression Note    Patient Details  Name: Candace Bailey MRN: [id number] Date of Birth: 1971-03-19  Transition of Care [facility]) CM/SW Contact  Howell Rucks, RN Phone Number: 10/29/2023, 1:25 PM  Clinical Narrative:  Met with patient and pt's sister at bedside, confirmed accepted bed offer at [facility]. [employer] auth initiated via [place] for short term rehab/SNF. Auth ID [id number], Berkley Harvey pending. Lowella Bandy, admissions coordinator w/[facility], notified. TOC will continue to follow.      Expected Discharge Plan: IP Rehab Facility Barriers to Discharge: Continued Medical Work up  Expected Discharge Plan and Services       Living arrangements for the past 2 months: Apartment Expected Discharge Date: 10/29/23                                     Social Determinants of Health (SDOH) Interventions SDOH Screenings   Food Insecurity: No Food Insecurity (10/23/2023)  Housing: Low Risk  (10/23/2023)  Transportation Needs: No Transportation Needs (10/23/2023)  Utilities: Not At Risk (10/23/2023)  Depression (PHQ2-9): Low Risk  (12/04/2020)  Tobacco Use: Low Risk  (10/23/2023)    Readmission Risk Interventions    10/29/2023    1:25 PM  Readmission Risk Prevention Plan  Post Dischage Appt Complete  Medication Screening Complete  Transportation Screening Complete

## 2023-10-29 NOTE — Progress Notes (Signed)
 Patient ID: Candace Bailey, female   DOB: [birth year], 53 y.o.   MRN: [id number] Can be discharged today if short-term SNF bed available.

## 2023-10-29 NOTE — Progress Notes (Signed)
 Physical Therapy Treatment Patient Details Name: Candace Bailey MRN: [id number] DOB: 1971-05-13 Today's Date: 10/29/2023   History of Present Illness Pt is a 53 year old female s/p L THA on 10/23/23.  PMHx: TBI in 1973, speech abnormality, left hemiplegia    PT Comments  POD # 6 PT - Cognition Comments: AxO x 3 pleasant and following all directions.  Hx TBI with speech difficulty but highly functioning. Funny. Motivated. Was living home alone. General transfer comment: pt requires increased time but able to rise and place UEs on platforms as well as reach back to control descent; CGA Min Assist for safety present with initial posterior lean   General Gait Details: slowly progressing.  tolerated an increased distance of 28 feet using B platform RW and a piece of Thera Band around Pt's L ankle secured to L walker leg to increase L ABd during swing phase of gait. Personal wc following for safety for Pt to sit when fatigued.  Excessive lean on B platform.  Attempted her regular rollator walker however unable to even take one step due to L LE instability/knee bucckle. HIGH FALL RISK. Pt will need ST Rehab at SNF to address mobility and functional decline prior to safely returning home.    If plan is discharge home, recommend the following: Assistance with cooking/housework;Assist for transportation;A little help with walking and/or transfers;A little help with bathing/dressing/bathroom   Can travel by private vehicle     No  Equipment Recommendations       Recommendations for Other Services       Precautions / Restrictions Precautions Precautions: Fall Precaution/Restrictions Comments: L AFO, works better in shoes, HX TBI L Hemi Restrictions Weight Bearing Restrictions Per Provider Order: No Other Position/Activity Restrictions: WBAT     Mobility  Bed Mobility               General bed mobility comments: Pt OOB in her personal wc    Transfers Overall transfer level: Needs  assistance Equipment used: Bilateral platform walker Transfers: Sit to/from Stand Sit to Stand: Contact guard assist, +2 safety/equipment, Min assist           General transfer comment: pt requires increased time but able to rise and place UEs on platforms as well as reach back to control descent; CGA Min Assist for safety present with initial posterior lean    Ambulation/Gait Ambulation/Gait assistance: Min assist, +2 safety/equipment Gait Distance (Feet): 28 Feet Assistive device: Bilateral platform walker Gait Pattern/deviations: Step-to pattern, Knee flexed in stance - left, Narrow base of support, Decreased stance time - left, Decreased weight shift to left Gait velocity: decreased     General Gait Details: slowly progressing.  tolerated an increased distance of 28 feet using B platform RW and a piece of Thera Band around Pt's L ankle secured to L walker leg to increase L ABd during swing phase of gait. Personal wc following for safety for Pt to sit when fatigued.  Excessive lean on B platform.  Attempted her regular rollator walker however unable to even take one step due to L LE instability/knee bucckle. HIGH FALL RISK.   Stairs             Wheelchair Mobility     Tilt Bed    Modified Rankin (Stroke Patients Only)       Balance  Communication Communication Factors Affecting Communication: Reduced clarity of speech  Cognition   Behavior During Therapy: WFL for tasks assessed/performed   PT - Cognitive impairments: No apparent impairments                       PT - Cognition Comments: AxO x 3 pleasant and following all directions.  Hx TBI with speech difficulty but highly functioning. Funny. Motivated. Following commands: Intact      Cueing Cueing Techniques: Verbal cues  Exercises      General Comments        Pertinent Vitals/Pain Pain Assessment Pain Assessment: Faces Faces  Pain Scale: Hurts a little bit Pain Location: left hip, "not bad" Pain Descriptors / Indicators: Sore, Grimacing, Guarding, Operative site guarding Pain Intervention(s): Monitored during session, Premedicated before session, Repositioned    Home Living                          Prior Function            PT Goals (current goals can now be found in the care plan section) Progress towards PT goals: Progressing toward goals    Frequency    7X/week      PT Plan      Co-evaluation              AM-PAC PT "6 Clicks" Mobility   Outcome Measure  Help needed turning from your back to your side while in a flat bed without using bedrails?: A Lot Help needed moving from lying on your back to sitting on the side of a flat bed without using bedrails?: A Lot Help needed moving to and from a bed to a chair (including a wheelchair)?: A Lot Help needed standing up from a chair using your arms (e.g., wheelchair or bedside chair)?: A Lot Help needed to walk in hospital room?: A Lot Help needed climbing 3-5 steps with a railing? : Total 6 Click Score: 11    End of Session Equipment Utilized During Treatment: Gait belt Activity Tolerance: Patient tolerated treatment well Patient left: in chair;with call bell/phone within reach;with family/visitor present Nurse Communication: Mobility status PT Visit Diagnosis: Difficulty in walking, not elsewhere classified (R26.2);Unsteadiness on feet (R26.81)     Time: 4098-1191 PT Time Calculation (min) (ACUTE ONLY): 25 min  Charges:    $Gait Training: 8-22 mins $Therapeutic Activity: 8-22 mins PT General Charges $$ ACUTE PT VISIT: 1 Visit                     Felecia Shelling  PTA Acute  Rehabilitation Services Office M-F          [phone]

## 2023-10-29 NOTE — Plan of Care (Signed)
  Problem: Education: Goal: Knowledge of General Education information will improve Description: Including pain rating scale, medication(s)/side effects and non-pharmacologic comfort measures Outcome: Progressing   Problem: Nutrition: Goal: Adequate nutrition will be maintained Outcome: Progressing   Problem: Activity: Goal: Risk for activity intolerance will decrease Outcome: Progressing   

## 2023-10-30 DIAGNOSIS — R2681 Unsteadiness on feet: Secondary | ICD-10-CM | POA: Diagnosis not present

## 2023-10-30 DIAGNOSIS — R404 Transient alteration of awareness: Secondary | ICD-10-CM | POA: Diagnosis not present

## 2023-10-30 DIAGNOSIS — Z96642 Presence of left artificial hip joint: Secondary | ICD-10-CM | POA: Diagnosis not present

## 2023-10-30 DIAGNOSIS — Z7901 Long term (current) use of anticoagulants: Secondary | ICD-10-CM | POA: Diagnosis not present

## 2023-10-30 DIAGNOSIS — I69351 Hemiplegia and hemiparesis following cerebral infarction affecting right dominant side: Secondary | ICD-10-CM | POA: Diagnosis not present

## 2023-10-30 DIAGNOSIS — L299 Pruritus, unspecified: Secondary | ICD-10-CM | POA: Diagnosis not present

## 2023-10-30 DIAGNOSIS — Z471 Aftercare following joint replacement surgery: Secondary | ICD-10-CM | POA: Diagnosis not present

## 2023-10-30 DIAGNOSIS — R220 Localized swelling, mass and lump, head: Secondary | ICD-10-CM | POA: Diagnosis present

## 2023-10-30 DIAGNOSIS — M6281 Muscle weakness (generalized): Secondary | ICD-10-CM | POA: Diagnosis not present

## 2023-10-30 DIAGNOSIS — Z7401 Bed confinement status: Secondary | ICD-10-CM | POA: Diagnosis not present

## 2023-10-30 DIAGNOSIS — M1990 Unspecified osteoarthritis, unspecified site: Secondary | ICD-10-CM | POA: Diagnosis not present

## 2023-10-30 DIAGNOSIS — G8111 Spastic hemiplegia affecting right dominant side: Secondary | ICD-10-CM | POA: Diagnosis not present

## 2023-10-30 DIAGNOSIS — Z79899 Other long term (current) drug therapy: Secondary | ICD-10-CM | POA: Diagnosis not present

## 2023-10-30 DIAGNOSIS — R2689 Other abnormalities of gait and mobility: Secondary | ICD-10-CM | POA: Diagnosis not present

## 2023-10-30 DIAGNOSIS — Z743 Need for continuous supervision: Secondary | ICD-10-CM | POA: Diagnosis not present

## 2023-10-30 DIAGNOSIS — R001 Bradycardia, unspecified: Secondary | ICD-10-CM | POA: Diagnosis not present

## 2023-10-30 DIAGNOSIS — R1310 Dysphagia, unspecified: Secondary | ICD-10-CM | POA: Diagnosis not present

## 2023-10-30 DIAGNOSIS — T783XXA Angioneurotic edema, initial encounter: Secondary | ICD-10-CM | POA: Diagnosis not present

## 2023-10-30 DIAGNOSIS — R609 Edema, unspecified: Secondary | ICD-10-CM | POA: Diagnosis not present

## 2023-10-30 DIAGNOSIS — S0689AS Other specified intracranial injury with loss of consciousness status unknown, sequela: Secondary | ICD-10-CM | POA: Diagnosis not present

## 2023-10-30 DIAGNOSIS — M1612 Unilateral primary osteoarthritis, left hip: Secondary | ICD-10-CM | POA: Diagnosis not present

## 2023-10-30 NOTE — Care Management Important Message (Signed)
 Important Message  Patient Details IM Letter given to the Patient. Name: Candace Bailey MRN: [id number] Date of Birth: [birth year]   Important Message Given:  Yes - Medicare IM     Caren Macadam 10/30/2023, 10:30 AM

## 2023-10-30 NOTE — Progress Notes (Signed)
 Patient ID: Candace Bailey, female   DOB: [birth year], 53 y.o.   MRN: [id number] I was able to change the patient's left hip dressing today.  Her incision is stable.  Her left operative hip is stable.  She can be discharged to short-term skilled nursing today.

## 2023-10-30 NOTE — TOC Progression Note (Signed)
 Transition of Care [facility]) - Progression Note    Patient Details  Name: Candace Bailey MRN: [id number] Date of Birth: July 09, 1971  Transition of Care [facility]) CM/SW Contact  Howell Rucks, RN Phone Number: 10/30/2023, 8:43 AM  Clinical Narrative:   Per [facility] auth approved :  Plan Auth ID [id number]  Auth ID [id number], days approved 10/29/2023-10/31/2023, team notified.     Expected Discharge Plan: IP Rehab Facility Barriers to Discharge: Continued Medical Work up  Expected Discharge Plan and Services       Living arrangements for the past 2 months: Apartment Expected Discharge Date: 10/30/23                                     Social Determinants of Health (SDOH) Interventions SDOH Screenings   Food Insecurity: No Food Insecurity (10/23/2023)  Housing: Low Risk  (10/23/2023)  Transportation Needs: No Transportation Needs (10/23/2023)  Utilities: Not At Risk (10/23/2023)  Depression (PHQ2-9): Low Risk  (12/04/2020)  Tobacco Use: Low Risk  (10/23/2023)    Readmission Risk Interventions    10/29/2023    1:25 PM  Readmission Risk Prevention Plan  Post Dischage Appt Complete  Medication Screening Complete  Transportation Screening Complete

## 2023-10-30 NOTE — Progress Notes (Signed)
 Patient ID: Candace Bailey, female   DOB: [birth year], 53 y.o.   MRN: [id number] The plan is to discharge the patient today to short-term skilled nursing.  A previous discharge summary has been put in epic and I did put an addendum in terms of the date of discharge for today.

## 2023-11-02 ENCOUNTER — Emergency Department (HOSPITAL_BASED_OUTPATIENT_CLINIC_OR_DEPARTMENT_OTHER)

## 2023-11-02 ENCOUNTER — Other Ambulatory Visit: Payer: Self-pay

## 2023-11-02 ENCOUNTER — Observation Stay (HOSPITAL_COMMUNITY)
Admission: EM | Admit: 2023-11-02 | Discharge: 2023-11-03 | Disposition: A | Discharge status: Home / Self Care | Attending: Internal Medicine | Admitting: Internal Medicine

## 2023-11-02 ENCOUNTER — Encounter (HOSPITAL_COMMUNITY): Payer: Self-pay | Admitting: Emergency Medicine

## 2023-11-02 DIAGNOSIS — M1990 Unspecified osteoarthritis, unspecified site: Secondary | ICD-10-CM | POA: Insufficient documentation

## 2023-11-02 DIAGNOSIS — L299 Pruritus, unspecified: Secondary | ICD-10-CM | POA: Diagnosis not present

## 2023-11-02 DIAGNOSIS — R001 Bradycardia, unspecified: Secondary | ICD-10-CM | POA: Diagnosis not present

## 2023-11-02 DIAGNOSIS — Z7901 Long term (current) use of anticoagulants: Secondary | ICD-10-CM | POA: Insufficient documentation

## 2023-11-02 DIAGNOSIS — Z79899 Other long term (current) drug therapy: Secondary | ICD-10-CM | POA: Insufficient documentation

## 2023-11-02 DIAGNOSIS — R609 Edema, unspecified: Secondary | ICD-10-CM | POA: Diagnosis not present

## 2023-11-02 DIAGNOSIS — Z96642 Presence of left artificial hip joint: Secondary | ICD-10-CM | POA: Insufficient documentation

## 2023-11-02 DIAGNOSIS — R220 Localized swelling, mass and lump, head: Secondary | ICD-10-CM | POA: Diagnosis present

## 2023-11-02 DIAGNOSIS — T783XXA Angioneurotic edema, initial encounter: Principal | ICD-10-CM | POA: Insufficient documentation

## 2023-11-02 DIAGNOSIS — T7840XA Allergy, unspecified, initial encounter: Principal | ICD-10-CM

## 2023-11-02 LAB — CBC WITH DIFFERENTIAL/PLATELET
Abs Immature Granulocytes: 0.07 10*3/uL (ref 0.00–0.07)
Basophils Absolute: 0 10*3/uL (ref 0.0–0.1)
Basophils Relative: 0 %
Eosinophils Absolute: 0.1 10*3/uL (ref 0.0–0.5)
Eosinophils Relative: 2 %
HCT: 27.6 % — ABNORMAL LOW (ref 36.0–46.0)
Hemoglobin: 8.9 g/dL — ABNORMAL LOW (ref 12.0–15.0)
Immature Granulocytes: 1 %
Lymphocytes Relative: 13 %
Lymphs Abs: 1.2 10*3/uL (ref 0.7–4.0)
MCH: 28.4 pg (ref 26.0–34.0)
MCHC: 32.2 g/dL (ref 30.0–36.0)
MCV: 88.2 fL (ref 80.0–100.0)
Monocytes Absolute: 0.3 10*3/uL (ref 0.1–1.0)
Monocytes Relative: 3 %
Neutro Abs: 7.6 10*3/uL (ref 1.7–7.7)
Neutrophils Relative %: 81 %
Platelets: 464 10*3/uL — ABNORMAL HIGH (ref 150–400)
RBC: 3.13 MIL/uL — ABNORMAL LOW (ref 3.87–5.11)
RDW: 12.2 % (ref 11.5–15.5)
WBC: 9.3 10*3/uL (ref 4.0–10.5)
nRBC: 0 % (ref 0.0–0.2)

## 2023-11-02 LAB — COMPREHENSIVE METABOLIC PANEL WITH GFR
ALT: 20 U/L (ref 0–44)
AST: 18 U/L (ref 15–41)
Albumin: 3 g/dL — ABNORMAL LOW (ref 3.5–5.0)
Alkaline Phosphatase: 83 U/L (ref 38–126)
Anion gap: 9 (ref 5–15)
BUN: 17 mg/dL (ref 6–20)
CO2: 24 mmol/L (ref 22–32)
Calcium: 8.6 mg/dL — ABNORMAL LOW (ref 8.9–10.3)
Chloride: 104 mmol/L (ref 98–111)
Creatinine, Ser: 0.58 mg/dL (ref 0.44–1.00)
GFR, Estimated: >60 mL/min (ref >60)
Glucose, Bld: 102 mg/dL — ABNORMAL HIGH (ref 70–99)
Potassium: 3.9 mmol/L (ref 3.5–5.1)
Sodium: 137 mmol/L (ref 135–145)
Total Bilirubin: 0.3 mg/dL (ref 0.0–1.2)
Total Protein: 6.7 g/dL (ref 6.5–8.1)

## 2023-11-02 MED ORDER — OXYCODONE HCL 5 MG PO TABS: 5.0000 mg | ORAL_TABLET | ORAL | Status: DC | PRN

## 2023-11-02 MED ORDER — DIPHENHYDRAMINE HCL 50 MG/ML IJ SOLN
25.0000 mg | Freq: Four times a day (QID) | INTRAMUSCULAR | Status: DC
  Administered 2023-11-02 – 2023-11-03 (×3): 25 mg via INTRAVENOUS
  Filled 2023-11-02 (×3): qty 1

## 2023-11-02 MED ORDER — ONDANSETRON HCL 4 MG/2ML IJ SOLN
4.0000 mg | Freq: Four times a day (QID) | INTRAMUSCULAR | Status: DC | PRN
Start: 2023-11-02 — End: 2023-11-03

## 2023-11-02 MED ORDER — FAMOTIDINE IN NACL 20-0.9 MG/50ML-% IV SOLN
20.0000 mg | Freq: Once | INTRAVENOUS | Status: AC
  Administered 2023-11-02: 20 mg via INTRAVENOUS
  Filled 2023-11-02: qty 50

## 2023-11-02 MED ORDER — EPINEPHRINE 0.3 MG/0.3ML IJ SOAJ
0.3000 mg | Freq: Once | INTRAMUSCULAR | Status: AC
  Administered 2023-11-02: 0.3 mg via INTRAMUSCULAR
  Filled 2023-11-02: qty 0.3

## 2023-11-02 MED ORDER — PREDNISONE 50 MG PO TABS
50.0000 mg | ORAL_TABLET | Freq: Every day | ORAL | Status: DC
  Administered 2023-11-02 – 2023-11-03 (×2): 50 mg via ORAL
  Filled 2023-11-02 (×2): qty 1

## 2023-11-02 MED ORDER — ACETAMINOPHEN 325 MG PO TABS: 650.0000 mg | ORAL_TABLET | Freq: Four times a day (QID) | ORAL | Status: DC | PRN

## 2023-11-02 MED ORDER — IBUPROFEN 200 MG PO TABS
600.0000 mg | ORAL_TABLET | Freq: Four times a day (QID) | ORAL | Status: DC | PRN
  Administered 2023-11-02: 600 mg via ORAL
  Filled 2023-11-02: qty 3

## 2023-11-02 MED ORDER — ACETAMINOPHEN 650 MG RE SUPP: 650.0000 mg | Freq: Four times a day (QID) | RECTAL | Status: DC | PRN

## 2023-11-02 MED ORDER — ONDANSETRON HCL 4 MG PO TABS: 4.0000 mg | ORAL_TABLET | Freq: Four times a day (QID) | ORAL | Status: DC | PRN

## 2023-11-02 MED ORDER — ENOXAPARIN SODIUM 40 MG/0.4ML IJ SOSY
40.0000 mg | PREFILLED_SYRINGE | INTRAMUSCULAR | Status: DC
Start: 2023-11-02 — End: 2023-11-03
  Administered 2023-11-02: 40 mg via SUBCUTANEOUS
  Filled 2023-11-02: qty 0.4

## 2023-11-02 MED ORDER — METHYLPREDNISOLONE SODIUM SUCC 125 MG IJ SOLR
125.0000 mg | Freq: Once | INTRAMUSCULAR | Status: AC
  Administered 2023-11-02: 125 mg via INTRAVENOUS
  Filled 2023-11-02: qty 2

## 2023-11-02 MED ORDER — ASPIRIN 81 MG PO CHEW
81.0000 mg | CHEWABLE_TABLET | Freq: Two times a day (BID) | ORAL | Status: DC
Start: 2023-11-02 — End: 2023-11-03
  Administered 2023-11-02 – 2023-11-03 (×2): 81 mg via ORAL
  Filled 2023-11-02 (×2): qty 1

## 2023-11-02 MED ORDER — DIPHENHYDRAMINE HCL 50 MG/ML IJ SOLN
25.0000 mg | Freq: Once | INTRAMUSCULAR | Status: AC
  Administered 2023-11-02: 25 mg via INTRAVENOUS
  Filled 2023-11-02: qty 1

## 2023-11-02 NOTE — Progress Notes (Signed)
 RLE venous duplex has been completed.  Preliminary results given to Vonita Moss, PA-C.   Results can be found under chart review under CV PROC. 11/02/2023 6:08 PM Mikele Sifuentes RVT, RDMS

## 2023-11-02 NOTE — ED Provider Notes (Signed)
 [place] EMERGENCY DEPARTMENT AT [facility] Provider Note   CSN: [id number] Arrival date & time: 11/02/23  1546     History  Chief Complaint  Patient presents with   Allergic Reaction    Candace Bailey is a 53 y.o. female.  53 year old female with a history of TBI who presents emergency department with concerns for allergic reaction.  Patient reports that yesterday she noticed some red spots on her hands.  Today started developing diffuse urticaria as well as swelling of her lips.  Does itch.  No difficulty speaking or swallowing.  No shortness of breath.  No nausea or vomiting or diarrhea.  Is at a new facility and they have been using a new cream.  Also reports that she was putting Benadryl cream on her hands but has used this in the past and has never had this happen before.  Did have a recent total hip replacement.  Not on lisinopril or any new medications.       Home Medications Prior to Admission medications   Medication Sig Start Date End Date Taking? Authorizing Provider  aspirin 81 MG chewable tablet Chew 1 tablet (81 mg total) by mouth 2 (two) times daily. 10/27/23   Kathryne Hitch, MD  ibuprofen (ADVIL,MOTRIN) 200 MG tablet Take 200-400 mg by mouth every 6 (six) hours as needed for moderate pain (pain score 4-6). pain    [provider]  methocarbamol (ROBAXIN) 500 MG tablet Take 1 tablet (500 mg total) by mouth every 6 (six) hours as needed for muscle spasms. 10/27/23   Kathryne Hitch, MD  oxyCODONE (OXY IR/ROXICODONE) 5 MG immediate release tablet Take 1 tablet (5 mg total) by mouth every 6 (six) hours as needed for moderate pain (pain score 4-6) (pain score 4-6). 10/27/23   Kathryne Hitch, MD      Allergies    Latex    Review of Systems   Review of Systems  Physical Exam Updated Vital Signs BP (!) 127/108   Pulse 88   Temp 98.1 F (36.7 C)   Resp (!) 24   LMP 04/05/2016   SpO2 97%  Physical Exam Vitals and  nursing note reviewed.  Constitutional:      General: She is not in acute distress.    Appearance: She is well-developed. She is not ill-appearing.  HENT:     Head: Normocephalic and atraumatic.     Right Ear: External ear normal.     Left Ear: External ear normal.     Nose: Nose normal.     Mouth/Throat:     Mouth: Mucous membranes are moist.     Pharynx: Oropharynx is clear.     Comments: Lip swelling.  No tongue swelling.  No soft palate swelling. Eyes:     Extraocular Movements: Extraocular movements intact.     Conjunctiva/sclera: Conjunctivae normal.     Pupils: Pupils are equal, round, and reactive to light.  Cardiovascular:     Rate and Rhythm: Normal rate and regular rhythm.     Heart sounds: No murmur heard. Pulmonary:     Effort: Pulmonary effort is normal. No respiratory distress.     Breath sounds: Normal breath sounds.  Musculoskeletal:     Cervical back: Normal range of motion and neck supple.     Right lower leg: No edema.     Left lower leg: No edema.  Skin:    General: Skin is warm and dry.     Findings: Rash (  See images below) present.  Neurological:     Mental Status: She is alert and oriented to person, place, and time. Mental status is at baseline.  Psychiatric:        Mood and Affect: Mood normal.     RUE:      ED Results / Procedures / Treatments   Labs (all labs ordered are listed, but only abnormal results are displayed) Labs Reviewed  CBC WITH DIFFERENTIAL/PLATELET - Abnormal; Notable for the following components:      Result Value   RBC 3.13 (*)    Hemoglobin 8.9 (*)    HCT 27.6 (*)    Platelets 464 (*)    All other components within normal limits  COMPREHENSIVE METABOLIC PANEL WITH GFR - Abnormal; Notable for the following components:   Glucose, Bld 102 (*)    Calcium 8.6 (*)    Albumin 3.0 (*)    All other components within normal limits    EKG None  Radiology VAS [place] LOWER EXTREMITY VENOUS (DVT) (ONLY [facility] & [facility]) Result  Date: 11/02/2023  Lower Venous DVT Study Patient Name:  JARROD BODKINS  Date of Exam:   11/02/2023 Medical Rec #: [id number]    Accession #:    [id number] Date of Birth: 1971/01/24    Patient Gender: F Patient Age:   60 years Exam Location:  [facility] Procedure:      VAS [place] LOWER EXTREMITY VENOUS (DVT) Referring Phys: Molly Maduro Myrah Strawderman --------------------------------------------------------------------------------  Indications: Pain, and Swelling.  Risk Factors: Surgery Left hip replacement 10/23/2023. Limitations: Body habitus and poor ultrasound/tissue interface. Comparison Study: No previous exams Performing Technologist: Jody Hill RVT, RDMS  Examination Guidelines: A complete evaluation includes B-mode imaging, spectral Doppler, color Doppler, and power Doppler as needed of all accessible portions of each vessel. Bilateral testing is considered an integral part of a complete examination. Limited examinations for reoccurring indications may be performed as noted. The reflux portion of the exam is performed with the patient in reverse Trendelenburg.  +-----+---------------+---------+-----------+----------+--------------+ RIGHTCompressibilityPhasicitySpontaneityPropertiesThrombus Aging +-----+---------------+---------+-----------+----------+--------------+ CFV  Full           Yes      Yes                                 +-----+---------------+---------+-----------+----------+--------------+   +---------+---------------+---------+-----------+----------+-------------------+ LEFT     CompressibilityPhasicitySpontaneityPropertiesThrombus Aging      +---------+---------------+---------+-----------+----------+-------------------+ CFV      Full           No       Yes        pulsatile                     +---------+---------------+---------+-----------+----------+-------------------+ SFJ      Full                                                              +---------+---------------+---------+-----------+----------+-------------------+ FV Prox  Full           Yes      Yes                                      +---------+---------------+---------+-----------+----------+-------------------+  FV Mid   Full           Yes      Yes                                      +---------+---------------+---------+-----------+----------+-------------------+ FV DistalFull           Yes      Yes                                      +---------+---------------+---------+-----------+----------+-------------------+ PFV                     No       Yes        pulsatile                     +---------+---------------+---------+-----------+----------+-------------------+ POP      Full           Yes      Yes                                      +---------+---------------+---------+-----------+----------+-------------------+ PTV                                                   Not well visualized +---------+---------------+---------+-----------+----------+-------------------+ PERO     Full                                                             +---------+---------------+---------+-----------+----------+-------------------+    Summary: RIGHT: - No evidence of common femoral vein obstruction.   LEFT: - There is no evidence of deep vein thrombosis in the lower extremity.  - No cystic structure found in the popliteal fossa.  *See table(s) above for measurements and observations.    Preliminary     Procedures Procedures    Medications Ordered in ED Medications  aspirin chewable tablet 81 mg (has no administration in time range)  enoxaparin (LOVENOX) injection 40 mg (has no administration in time range)  acetaminophen (TYLENOL) tablet 650 mg (has no administration in time range)    Or  acetaminophen (TYLENOL) suppository 650 mg (has no administration in time range)  oxyCODONE (Oxy IR/ROXICODONE) immediate release tablet 5 mg (has no  administration in time range)  ondansetron (ZOFRAN) tablet 4 mg (has no administration in time range)    Or  ondansetron (ZOFRAN) injection 4 mg (has no administration in time range)  EPINEPHrine (EPI-PEN) injection 0.3 mg (0.3 mg Intramuscular Given 11/02/23 1644)  methylPREDNISolone sodium succinate (SOLU-MEDROL) 125 mg/2 mL injection 125 mg (125 mg Intravenous Given 11/02/23 1643)  famotidine (PEPCID) IVPB 20 mg premix (20 mg Intravenous New Bag/Given 11/02/23 1648)  diphenhydrAMINE (BENADRYL) injection 25 mg (25 mg Intravenous Given 11/02/23 1642)    ED Course/ Medical Decision Making/ A&P Clinical Course as of 11/02/23 1814  Sun Nov 02, 2023  1637 Mother at the  bedside.  Also complaining of left lower extremity swelling.  Will order DVT ultrasound.  Says that she has been getting Benadryl for years and has never had an allergic reaction [RP]  1745 Negative for DVT.  [RP]  1805 Discussed with Dr. Avie Arenas for admission. [RP]    Clinical Course User Index [RP] Rondel Baton, MD                                 Medical Decision Making Amount and/or Complexity of Data Reviewed Labs: ordered.  Risk Prescription drug management. Decision regarding hospitalization.   Candace Bailey is a 53 y.o. female with comorbidities that complicate the patient evaluation including TBI and recent left hip replacement who presents emergency department with rash and lip swelling  Initial Ddx:  Allergic reaction, angioedema, anaphylaxis, DVT, postoperative swelling  MDM/Course:  Patient presents emergency department with a diffuse urticarial rash and lip swelling.  Initially was saying that it started yesterday but upon re-evaluation with her mother present it appears the rash started this morning.  Only new exposure is a skin cream that she has been getting.  No family history of angioedema.  Not on lisinopril.  Not on any other medications that would cause angioedema.  No food allergies.  Also  complaining of left lower extremity swelling and with her recent surgery did obtain an ultrasound of the leg that did not show evidence of DVT.  Patient was given epinephrine, Solu-Medrol, Pepcid, and Benadryl and had improvement of her rash but persistence of her lip swelling.  Admitted to hospitalist for observation.  This patient presents to the ED for concern of complaints listed in HPI, this involves an extensive number of treatment options, and is a complaint that carries with it a high risk of complications and morbidity. Disposition including potential need for admission considered.   Dispo: Admit to Floor  Additional history obtained from mother Records reviewed Outpatient Clinic Notes The following labs were independently interpreted: Chemistry and show no acute abnormality I personally reviewed and interpreted cardiac monitoring: normal sinus rhythm  I personally reviewed and interpreted the pt's EKG: see above for interpretation  I have reviewed the patients home medications and made adjustments as needed Consults: Hospitalist Social Determinants of health:  Rehab resident  Portions of this note were generated with Scientist, clinical (histocompatibility and immunogenetics). Dictation errors may occur despite best attempts at proofreading.    CRITICAL CARE Performed by: Rondel Baton   Total critical care time: 30 minutes  Critical care time was exclusive of separately billable procedures and treating other patients.  Critical care was necessary to treat or prevent imminent or life-threatening deterioration.  Critical care was time spent personally by me on the following activities: development of treatment plan with patient and/or surrogate as well as nursing, discussions with consultants, evaluation of patient's response to treatment, examination of patient, obtaining history from patient or surrogate, ordering and performing treatments and interventions, ordering and review of laboratory studies,  ordering and review of radiographic studies, pulse oximetry and re-evaluation of patient's condition.   Final Clinical Impression(s) / ED Diagnoses Final diagnoses:  Allergic reaction, initial encounter  Angioedema, initial encounter    Rx / DC Orders ED Discharge Orders     None         Rondel Baton, MD 11/02/23 [phone]

## 2023-11-02 NOTE — ED Triage Notes (Signed)
 Patient presents due to itching on the body and face perhaps due to the body wash she has been using. Red itchy patches are visualized There has also been upper lip swelling post use of benadryl.    Recent hip replacement.  EMS vitals: 138/78 BP 98% SPO2 on room air 93 P
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
## 2023-11-03 ENCOUNTER — Telehealth: Payer: Self-pay

## 2023-11-03 DIAGNOSIS — T783XXA Angioneurotic edema, initial encounter: Secondary | ICD-10-CM | POA: Diagnosis not present

## 2023-11-03 MED ORDER — ACETAMINOPHEN 325 MG PO TABS: 650.0000 mg | ORAL_TABLET | Freq: Four times a day (QID) | ORAL | Status: AC | PRN

## 2023-11-03 NOTE — Discharge Summary (Signed)
 Physician Discharge Summary  Rital Cavey ZOX:[id number] DOB: 1971/03/23 DOA: 11/02/2023  PCP: Emilio Aspen, MD  Admit date: 11/02/2023 Discharge date: 11/03/2023  Admitted From: [facility] Disposition: Home with home health  Recommendations for Outpatient Follow-up:  Follow up with PCP in 1-2 weeks Outpatient follow-up with orthopedics, Dr. Magnus Ivan as scheduled  Home Health: PT Equipment/Devices: None, already has walker at home  Discharge Condition: Stable CODE STATUS: Full code Diet recommendation: Regular diet  History of present illness:  Sadee Osland is a 53 year old female with past medical history significant for traumatic brain injury, osteoarthritis with recent total hip arthroplasty March 2025 who presented to [facility] ED from [facility] with acute onset facial swelling and fullness.  Apparently, facility had used a new cream just prior to developing the symptoms.  Concern for allergic reaction, EMS was called and patient was transferred to the ED for further evaluation management.  On arrival patient was noted to have urticaria and swelling of her lips but no compromise of her airway.  In the ED, temperature 98.1 F, HR 88, RR 24, BP 127/108, SpO2 97% on room air.  WBC 9.3, hemoglobin 8.9, platelet count 464.  Sodium 137, potassium 3.9, chloride 104, CO2 24, glucose 102, BUN 17, creatinine 0.58.  AST 18, ALT 20, total bilirubin 0.3.  Patient was started on IV Benadryl, given epinephrine and Solu-Medrol.  TRH consulted for admission/observation for allergic reaction.  Hospital course:  Angioedema: Resolved Patient presenting from rehab with acute onset urticaria, swelling of her lips concerning for angioedema.  Suspect etiology related to new skin cream use.  No previous history of allergic reactions.  Patient was treated with IV Solu-Medrol followed by oral prednisone, Benadryl with resolution of symptoms.  No compromise in airway and urticaria and lip  swelling has resolved.  Osteoarthritis s/p recent total hip arthroplasty Follows with orthopedics, Dr. Magnus Ivan.  Patient's mother wishes to discharge home with home health rather than going back to SNF rehab.  Continue postoperative DVT prophylaxis with aspirin 81 mg p.o. twice daily.  Outpatient follow-up with orthopedics.  Discharge Diagnoses:  Principal Problem:   Angioedema    Discharge Instructions  Discharge Instructions     Call MD for:  difficulty breathing, headache or visual disturbances   Complete by: As directed    Call MD for:  extreme fatigue   Complete by: As directed    Call MD for:  persistant dizziness or light-headedness   Complete by: As directed    Call MD for:  persistant nausea and vomiting   Complete by: As directed    Call MD for:  severe uncontrolled pain   Complete by: As directed    Call MD for:  temperature >100.4   Complete by: As directed    Diet - low sodium heart healthy   Complete by: As directed    Increase activity slowly   Complete by: As directed       Allergies as of 11/03/2023       Reactions   Latex    unsure        Medication List     TAKE these medications    acetaminophen 325 MG tablet Commonly known as: TYLENOL Take 2 tablets (650 mg total) by mouth every 6 (six) hours as needed for mild pain (pain score 1-3) (or Fever >/= 101).   aspirin 81 MG chewable tablet Chew 1 tablet (81 mg total) by mouth 2 (two) times daily. What changed: when to  take this   ibuprofen 200 MG tablet Commonly known as: ADVIL Take 400 mg by mouth at bedtime. pain        Follow-up Information     Emilio Aspen, MD. Schedule an appointment as soon as possible for a visit in 1 week(s).   Specialty: Internal Medicine Contact information: [address] [place] [address] [phone]         Kathryne Hitch, MD Follow up.   Specialty: Orthopedic Surgery Contact information: [address] [place] [address] [phone]                Allergies  Allergen Reactions   Latex     unsure    Consultations: None   Procedures/Studies: VAS [place] LOWER EXTREMITY VENOUS (DVT) (ONLY [facility] & [facility]) Result Date: 11/02/2023  Lower Venous DVT Study Patient Name:  HEBA IGE  Date of Exam:   11/02/2023 Medical Rec #: [id number]    Accession #:    [id number] Date of Birth: 04-12-1971    Patient Gender: F Patient Age:   53 years Exam Location:  [facility] Procedure:      VAS [place] LOWER EXTREMITY VENOUS (DVT) Referring Phys: Molly Maduro PATERSON --------------------------------------------------------------------------------  Indications: Pain, and Swelling.  Risk Factors: Surgery Left hip replacement 10/23/2023. Limitations: Body habitus and poor ultrasound/tissue interface. Comparison Study: No previous exams Performing Technologist: Jody Hill RVT, RDMS  Examination Guidelines: A complete evaluation includes B-mode imaging, spectral Doppler, color Doppler, and power Doppler as needed of all accessible portions of each vessel. Bilateral testing is considered an integral part of a complete examination. Limited examinations for reoccurring indications may be performed as noted. The reflux portion of the exam is performed with the patient in reverse Trendelenburg.  +-----+---------------+---------+-----------+----------+--------------+ RIGHTCompressibilityPhasicitySpontaneityPropertiesThrombus Aging +-----+---------------+---------+-----------+----------+--------------+ CFV  Full           Yes      Yes                                 +-----+---------------+---------+-----------+----------+--------------+   +--------+---------------+---------+-----------+----------+--------------------+ LEFT    CompressibilityPhasicitySpontaneityPropertiesThrombus Aging       +--------+---------------+---------+-----------+----------+--------------------+ CFV     Full           No       Yes         pulsatile                      +--------+---------------+---------+-----------+----------+--------------------+ SFJ     Full                                                              +--------+---------------+---------+-----------+----------+--------------------+ FV Prox Full           Yes      Yes                                       +--------+---------------+---------+-----------+----------+--------------------+ FV Mid  Full           Yes      Yes                                       +--------+---------------+---------+-----------+----------+--------------------+  FV      Full           Yes      Yes                                       Distal                                                                    +--------+---------------+---------+-----------+----------+--------------------+ PFV                    No       Yes        pulsatile patent by                                                                 color/doppler        +--------+---------------+---------+-----------+----------+--------------------+ POP     Full           Yes      Yes                                       +--------+---------------+---------+-----------+----------+--------------------+ PTV                                                  Not well visualized  +--------+---------------+---------+-----------+----------+--------------------+ PERO    Full                                                              +--------+---------------+---------+-----------+----------+--------------------+    Summary: RIGHT: - No evidence of common femoral vein obstruction.   LEFT: - There is no evidence of deep vein thrombosis in the lower extremity.  - No cystic structure found in the popliteal fossa.  *See table(s) above for measurements and observations.    Preliminary    DG Pelvis Portable Result Date: 10/23/2023 CLINICAL DATA:  Status post total left hip  arthroplasty. EXAM: PORTABLE PELVIS 1-2 VIEWS COMPARISON:  [facility] 07/07/2011, pelvis and left hip radiographs 09/17/2023 FINDINGS: Interval total left hip arthroplasty. No perihardware lucency is seen to indicate hardware failure or loosening. Expected postoperative changes of subcutaneous air medial and lateral to the left hip. Mild bilateral sacroiliac joint subchondral sclerosis. The right femoroacetabular joint space is maintained. No acute fracture or dislocation. IMPRESSION: Interval total left hip arthroplasty without evidence of hardware failure. Electronically Signed   By: Neita Garnet M.D.   On: 10/23/2023 12:41   DG HIP UNILAT WITH PELVIS 1V LEFT Result Date: 10/23/2023 CLINICAL DATA:  Elective  surgery. EXAM: DG HIP (WITH OR WITHOUT PELVIS) 1V*L* COMPARISON:  None Available. FINDINGS: Eleven fluoroscopic spot views of the pelvis and left hip obtained in the operating room. Sequential images during hip arthroplasty. Fluoroscopy time 29 seconds. Dose 2.37 mGy. IMPRESSION: Intraoperative fluoroscopy during left hip arthroplasty. Electronically Signed   By: Narda Rutherford M.D.   On: 10/23/2023 11:21   DG C-Arm 1-60 Min-No Report Result Date: 10/23/2023 Fluoroscopy was utilized by the requesting physician.  No radiographic interpretation.   DG C-Arm 1-60 Min-No Report Result Date: 10/23/2023 Fluoroscopy was utilized by the requesting physician.  No radiographic interpretation.     Subjective: Patient seen examined bedside, lying in bed.  Eating breakfast.  No complaints.  Angioedema resolved.  Discussed with patient's mother via telephone, wishes not to return to SNF with plan discharge with home health.  No other questions or concerns at this time.  Patient denies headache, no chest pain, no shortness of breath, no abdominal pain, no fever.  No acute events overnight per nurse staff.  Discharge Exam: Vitals:   11/02/23 2109 11/03/23 0044  BP: (!) 146/92 (!) 140/82  Pulse: (!) 106 86   Resp: 20 19  Temp: 97.9 F (36.6 C) 97.9 F (36.6 C)  SpO2: 98% 97%   Vitals:   11/02/23 1800 11/02/23 2109 11/03/23 0044 11/03/23 0403  BP: 133/71 (!) 146/92 (!) 140/82   Pulse: (!) 103 (!) 106 86   Resp: 19 20 19    Temp:  97.9 F (36.6 C) 97.9 F (36.6 C)   TempSrc:  Oral Oral   SpO2: 97% 98% 97%   Weight:    66.8 kg  Height:    4\' 11"  (1.499 m)    Physical Exam: GEN: NAD, alert  HEENT: NCAT, PERRL, EOMI, sclera clear, MMM, no perioral edema/rash PULM: CTAB w/o wheezes/crackles, normal respiratory effort on room air CV: RRR w/o M/G/R GI: abd soft, NTND, NABS, no R/G/M MSK: no peripheral edema, muscle strength globally intact 5/5 bilateral upper/lower extremities Integumentary: No rashes/lesions/wounds noted on exposed skin surfaces    The results of significant diagnostics from this hospitalization (including imaging, microbiology, ancillary and laboratory) are listed below for reference.     Microbiology: No results found for this or any previous visit (from the past 240 hours).   Labs: BNP (last 3 results) No results for input(s): "BNP" in the last 8760 hours. Basic Metabolic Panel: Recent Labs  Lab 11/02/23 1640  NA 137  K 3.9  CL 104  CO2 24  GLUCOSE 102*  BUN 17  CREATININE 0.58  CALCIUM 8.6*   Liver Function Tests: Recent Labs  Lab 11/02/23 1640  AST 18  ALT 20  ALKPHOS 83  BILITOT 0.3  PROT 6.7  ALBUMIN 3.0*   No results for input(s): "LIPASE", "AMYLASE" in the last 168 hours. No results for input(s): "AMMONIA" in the last 168 hours. CBC: Recent Labs  Lab 11/02/23 1640  WBC 9.3  NEUTROABS 7.6  HGB 8.9*  HCT 27.6*  MCV 88.2  PLT 464*   Cardiac Enzymes: No results for input(s): "CKTOTAL", "CKMB", "CKMBINDEX", "TROPONINI" in the last 168 hours. BNP: Invalid input(s): "POCBNP" CBG: No results for input(s): "GLUCAP" in the last 168 hours. D-Dimer No results for input(s): "DDIMER" in the last 72 hours. Hgb A1c No results for  input(s): "HGBA1C" in the last 72 hours. Lipid Profile No results for input(s): "CHOL", "HDL", "LDLCALC", "TRIG", "CHOLHDL", "LDLDIRECT" in the last 72 hours. Thyroid function studies No results for input(s): "  TSH", "T4TOTAL", "T3FREE", "THYROIDAB" in the last 72 hours.  Invalid input(s): "FREET3" Anemia work up No results for input(s): "VITAMINB12", "FOLATE", "FERRITIN", "TIBC", "IRON", "RETICCTPCT" in the last 72 hours. Urinalysis    Component Value Date/Time   COLORURINE YELLOW 09/10/2018 1108   APPEARANCEUR CLEAR 09/10/2018 1108   LABSPEC 1.011 09/10/2018 1108   PHURINE 6.0 09/10/2018 1108   GLUCOSEU NEGATIVE 09/10/2018 1108   HGBUR NEGATIVE 09/10/2018 1108   BILIRUBINUR NEGATIVE 09/10/2018 1108   KETONESUR NEGATIVE 09/10/2018 1108   PROTEINUR NEGATIVE 09/10/2018 1108   UROBILINOGEN 0.2 07/31/2011 2010   NITRITE NEGATIVE 09/10/2018 1108   LEUKOCYTESUR NEGATIVE 09/10/2018 1108   Sepsis Labs Recent Labs  Lab 11/02/23 1640  WBC 9.3   Microbiology No results found for this or any previous visit (from the past 240 hours).   Time coordinating discharge: Over 30 minutes  SIGNED:   Alvira Philips [place], DO  [facility] 11/03/2023, 11:54 AM

## 2023-11-03 NOTE — Progress Notes (Signed)
 Patient discharged home with [facility]. Discharge packet given and instructions reviewed with patient and parents. F/U instructions reviewed with verbalization of understanding. IV removed, tolerated well. DME to be delivered at verified address. Belongings in patient's position.

## 2023-11-03 NOTE — Telephone Encounter (Signed)
 Patient's mother called. She recently had surgery and has been at a facility. She was taken to a hospital and is now set to discharge back to the facility today. She wants to talk with Dr.Blackman urgently before discharge because she does not want Arpi Oshel going back  to the facility. She wants her to go home with HHPT. Please call her at #[phone]

## 2023-11-03 NOTE — Plan of Care (Signed)
  Problem: Nutrition: Goal: Adequate nutrition will be maintained Outcome: Progressing   Problem: Safety: Goal: Ability to remain free from injury will improve Outcome: Progressing   

## 2023-11-03 NOTE — TOC Transition Note (Signed)
 Transition of Care [facility]) - Discharge Note   Patient Details  Name: Candace Bailey MRN: [id number] Date of Birth: [birth year]  Transition of Care [facility]) CM/SW Contact:  Larrie Kass, LCSW Phone Number: 11/03/2023, 12:24 PM   Clinical Narrative:     Pt is from [facility] term rehab. Csw spoke with pt who has deffered to speaking with her mother. CSW spoke with pt's mother Corrie Dandy, who is requesting pt to return home with home health. CSW offered choice and provided Medicare star rating list. Pt mother has no preference in Home health company. CSW discussed barriers to arranging home health with pt's [employer]. Pt's mother is also requesting DME, she stated she would like the same walker pt used last week when she was in the hospital. CSW is requesting PT eve for DME rec.   Pt is rec for Bilateral platform rolling walker, CSW sent referral to [employer] , pt family is requesting DME to be delivered to pt's home.    CSW has reached out to the following [facility] agencies:  [facility]-declined  [facility]-declined [facility]-declined  [facility]-declined  [facility]-declined [facility]-declined [facility] declined  [facility] was able to accept pt for HHPT/OT.  Pt's family will provide transportation home no further TOC needs, TOC sign off.      Final next level of care: Home w Home Health Services Barriers to Discharge: Barriers Resolved   Patient Goals and CMS Choice Patient states their goals for this hospitalization and ongoing recovery are:: return home CMS Medicare.gov Compare Post Acute Care list provided to:: Patient Choice offered to / list presented to : Patient, Parent      Discharge Placement                    Patient and family notified of of transfer: 11/03/23  Discharge Plan and Services Additional resources added to the After Visit Summary for                                       Social Drivers of Health (SDOH) Interventions SDOH Screenings   Food Insecurity: No  Food Insecurity (11/03/2023)  Housing: Low Risk  (11/03/2023)  Transportation Needs: No Transportation Needs (11/03/2023)  Utilities: Not At Risk (11/03/2023)  Depression (PHQ2-9): Low Risk  (12/04/2020)  Tobacco Use: Low Risk  (11/02/2023)     Readmission Risk Interventions    10/29/2023    1:25 PM  Readmission Risk Prevention Plan  Post Dischage Appt Complete  Medication Screening Complete  Transportation Screening Complete

## 2023-11-03 NOTE — Telephone Encounter (Signed)
 Mom aware that she can let the case manager know that she wants her to go home with her and set her up with HHPT

## 2023-11-03 NOTE — Evaluation (Signed)
 Physical Therapy Evaluation Patient Details Name: Candace Bailey MRN: [id number] DOB: February 20, 1971 Today's Date: 11/03/2023  History of Present Illness  Pt is a 53 year old female with recent admission for elective THA, discharged to SNF and was readmitted through ER due to several allergic reaction  PMHx: TBI in 1973, speech abnormality, left hemiplegia, s/p L THA on 10/23/23.  Clinical Impression  Pt is s/p THA resulting in the deficits listed below (see PT Problem List).  Pt agreeable and motivated to work with PT,  amb ~ 47' with bil PFRW and min assist for balance and to facilitate L hip abduction and external rotation. Ordered PFRW, pt and family state plan is for home. Will benefit from HHPT and assist for mobility to prevent falls until pt returns to mod I level.  Will continue to follow in acute setting  Pt will benefit from acute skilled PT to increase their independence and safety with mobility to allow discharge.          If plan is discharge home, recommend the following: Assistance with cooking/housework;Assist for transportation;A little help with walking and/or transfers;A little help with bathing/dressing/bathroom   Can travel by private vehicle        Equipment Recommendations Other (comment) (bil PFRW)  Recommendations for Other Services       Functional Status Assessment Patient has had a recent decline in their functional status and demonstrates the ability to make significant improvements in function in a reasonable and predictable amount of time.     Precautions / Restrictions Precautions Precautions: Fall Precaution/Restrictions Comments: L AFO, works better in shoes, HX TBI L Hemi Restrictions Weight Bearing Restrictions Per Provider Order: No Other Position/Activity Restrictions: WBAT      Mobility  Bed Mobility               General bed mobility comments: Pt OOB in her personal wc    Transfers Overall transfer level: Needs assistance Equipment  used: Bilateral platform walker Transfers: Sit to/from Stand Sit to Stand: Contact guard assist           General transfer comment: pt requires increased time but able to rise and place UEs on platforms as well as reach back to control descent; CGA  for safety    Ambulation/Gait Ambulation/Gait assistance: Min assist Gait Distance (Feet): 38 Feet Assistive device: Bilateral platform walker Gait Pattern/deviations: Knee flexed in stance - left, Narrow base of support, Decreased stance time - left, Decreased weight shift to left, Scissoring, Step-through pattern Gait velocity: decreased     General Gait Details: intermittent assist to advance LLE, assist to maintain proper distance RW from self. cues for same  Stairs            Wheelchair Mobility     Tilt Bed    Modified Rankin (Stroke Patients Only)       Balance Overall balance assessment: Needs assistance Sitting-balance support: Feet unsupported, No upper extremity supported Sitting balance-Leahy Scale: Good     Standing balance support: Bilateral upper extremity supported, Reliant on assistive device for balance Standing balance-Leahy Scale: Poor Standing balance comment: +2 dynamic; able to maintain static stand wtih close supervision                             Pertinent Vitals/Pain Pain Assessment Pain Assessment: No/denies pain    Home Living Family/patient expects to be discharged to:: Private residence Living Arrangements: Alone Available Help at Discharge:  Family   Home Access: Level entry       Home Layout: One level Home Equipment: Rollator (4 wheels);Transport chair;Shower seat;Hand held shower head      Prior Function Prior Level of Function : Independent/Modified Independent             Mobility Comments: uses upright walker ADLs Comments: mod I     Extremity/Trunk Assessment        Lower Extremity Assessment Lower Extremity Assessment: RLE  deficits/detail RLE Deficits / Details: grossly WFL LLE Deficits / Details: L AFO, hx hemiparesis; anticipated post surgical weakness and pain       Communication        Cognition Arousal: Alert Behavior During Therapy: WFL for tasks assessed/performed   PT - Cognitive impairments: No apparent impairments                       PT - Cognition Comments: AxO x 3 pleasant and following all directions.  Hx TBI with speech difficulty but highly functioning.  Motivated. Following commands: Intact       Cueing       General Comments      Exercises     Assessment/Plan    PT Assessment Patient needs continued PT services  PT Problem List Decreased strength;Decreased activity tolerance;Decreased balance;Decreased mobility;Decreased knowledge of use of DME;Decreased coordination;Impaired tone       PT Treatment Interventions Gait training;DME instruction;Balance training;Neuromuscular re-education;Functional mobility training;Therapeutic activities;Therapeutic exercise;Patient/family education    PT Goals (Current goals can be found in the Care Plan section)  Acute Rehab PT Goals Patient Stated Goal: return to mod I to d/c home PT Goal Formulation: With patient/family Time For Goal Achievement: 11/17/23 Potential to Achieve Goals: Good    Frequency Min 5X/week     Co-evaluation               AM-PAC PT "6 Clicks" Mobility  Outcome Measure Help needed turning from your back to your side while in a flat bed without using bedrails?: A Little Help needed moving from lying on your back to sitting on the side of a flat bed without using bedrails?: A Little Help needed moving to and from a bed to a chair (including a wheelchair)?: A Little Help needed standing up from a chair using your arms (e.g., wheelchair or bedside chair)?: A Little Help needed to walk in hospital room?: A Little Help needed climbing 3-5 steps with a railing? : A Little 6 Click Score: 18     End of Session Equipment Utilized During Treatment: Gait belt Activity Tolerance: Patient tolerated treatment well Patient left: in chair;with call bell/phone within reach;with family/visitor present   PT Visit Diagnosis: Difficulty in walking, not elsewhere classified (R26.2);Unsteadiness on feet (R26.81)    Time: 7829-5621 PT Time Calculation (min) (ACUTE ONLY): 27 min   Charges:   PT Evaluation $PT Eval Low Complexity: 1 Low PT Treatments $Gait Training: 8-22 mins PT General Charges $$ ACUTE PT VISIT: 1 Visit         Candace Bailey, PT  Acute Rehab Dept [facility]) [phone]  11/03/2023   [facility] 11/03/2023, 2:15 PM

## 2023-11-05 ENCOUNTER — Telehealth: Payer: Self-pay | Admitting: Orthopaedic Surgery

## 2023-11-05 DIAGNOSIS — Z96642 Presence of left artificial hip joint: Secondary | ICD-10-CM | POA: Diagnosis not present

## 2023-11-05 DIAGNOSIS — Z471 Aftercare following joint replacement surgery: Secondary | ICD-10-CM | POA: Diagnosis not present

## 2023-11-05 DIAGNOSIS — Z993 Dependence on wheelchair: Secondary | ICD-10-CM | POA: Diagnosis not present

## 2023-11-05 DIAGNOSIS — I69351 Hemiplegia and hemiparesis following cerebral infarction affecting right dominant side: Secondary | ICD-10-CM | POA: Diagnosis not present

## 2023-11-05 DIAGNOSIS — Z556 Problems related to health literacy: Secondary | ICD-10-CM | POA: Diagnosis not present

## 2023-11-05 NOTE — Telephone Encounter (Signed)
 Pt's mother called stating pt ned a walker. She states pt was not giving one when she was discharged from hospital. Pt's mother is asking for a call so she can get Dr Magnus Ivan to order one for pt . Please call Mary at [phone].

## 2023-11-06 ENCOUNTER — Ambulatory Visit: Admitting: Orthopaedic Surgery

## 2023-11-06 ENCOUNTER — Encounter: Payer: Self-pay | Admitting: Orthopaedic Surgery

## 2023-11-06 DIAGNOSIS — Z96642 Presence of left artificial hip joint: Secondary | ICD-10-CM

## 2023-11-06 NOTE — Telephone Encounter (Signed)
Walker ordered on parachute.

## 2023-11-06 NOTE — Telephone Encounter (Signed)
 Called and advised.

## 2023-11-06 NOTE — Progress Notes (Signed)
 The patient is a 53 year old who is 2 weeks status post a left total hip arthroplasty to treat severe left hip arthritis.  She also has a history of a traumatic brain injury.  She is now home.  She does need some outpatient therapy and her mother is with her.  Her father is here today who and there good advocates for her health care.  I did give him a prescription for a stand-up platform walker.  They would prefer to go to outpatient physical therapy with [facility] outpatient [facility] at [facility].  We can work on getting that ordered.  I am able to easily move her left hip through range of motion.  Staples been removed from incision and Steri-Strips applied.  She has much improved leg length.  She was significantly short on her left side prior to surgery.  From our standpoint we will see her back in a month to see how she is doing from a mobility standpoint.  She can stop her baby aspirin twice a day.

## 2023-11-07 ENCOUNTER — Telehealth: Payer: Self-pay | Admitting: Orthopaedic Surgery

## 2023-11-07 ENCOUNTER — Other Ambulatory Visit: Payer: Self-pay

## 2023-11-07 DIAGNOSIS — Z993 Dependence on wheelchair: Secondary | ICD-10-CM | POA: Diagnosis not present

## 2023-11-07 DIAGNOSIS — I69351 Hemiplegia and hemiparesis following cerebral infarction affecting right dominant side: Secondary | ICD-10-CM | POA: Diagnosis not present

## 2023-11-07 DIAGNOSIS — Z96642 Presence of left artificial hip joint: Secondary | ICD-10-CM

## 2023-11-07 DIAGNOSIS — T783XXA Angioneurotic edema, initial encounter: Secondary | ICD-10-CM | POA: Diagnosis not present

## 2023-11-07 DIAGNOSIS — Z471 Aftercare following joint replacement surgery: Secondary | ICD-10-CM | POA: Diagnosis not present

## 2023-11-07 DIAGNOSIS — Z556 Problems related to health literacy: Secondary | ICD-10-CM | POA: Diagnosis not present

## 2023-11-07 NOTE — Telephone Encounter (Signed)
 Verbal order given

## 2023-11-07 NOTE — Telephone Encounter (Signed)
 Home Health called requesting verbal orders. Best call back number [phone]  Occupational therapy

## 2023-11-10 ENCOUNTER — Other Ambulatory Visit: Payer: Self-pay

## 2023-11-10 ENCOUNTER — Ambulatory Visit: Attending: Orthopaedic Surgery

## 2023-11-10 DIAGNOSIS — R293 Abnormal posture: Secondary | ICD-10-CM | POA: Diagnosis not present

## 2023-11-10 DIAGNOSIS — M6281 Muscle weakness (generalized): Secondary | ICD-10-CM | POA: Diagnosis not present

## 2023-11-10 DIAGNOSIS — Z556 Problems related to health literacy: Secondary | ICD-10-CM | POA: Diagnosis not present

## 2023-11-10 DIAGNOSIS — I69351 Hemiplegia and hemiparesis following cerebral infarction affecting right dominant side: Secondary | ICD-10-CM | POA: Diagnosis not present

## 2023-11-10 DIAGNOSIS — R2681 Unsteadiness on feet: Secondary | ICD-10-CM

## 2023-11-10 DIAGNOSIS — Z471 Aftercare following joint replacement surgery: Secondary | ICD-10-CM | POA: Diagnosis not present

## 2023-11-10 DIAGNOSIS — Z96642 Presence of left artificial hip joint: Secondary | ICD-10-CM | POA: Diagnosis not present

## 2023-11-10 DIAGNOSIS — R278 Other lack of coordination: Secondary | ICD-10-CM | POA: Insufficient documentation

## 2023-11-10 DIAGNOSIS — Z993 Dependence on wheelchair: Secondary | ICD-10-CM | POA: Diagnosis not present

## 2023-11-10 DIAGNOSIS — R252 Cramp and spasm: Secondary | ICD-10-CM

## 2023-11-10 DIAGNOSIS — R262 Difficulty in walking, not elsewhere classified: Secondary | ICD-10-CM | POA: Diagnosis not present
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
## 2023-11-19 ENCOUNTER — Ambulatory Visit

## 2023-11-19 ENCOUNTER — Telehealth: Payer: Self-pay | Admitting: Orthopedic Surgery

## 2023-11-19 DIAGNOSIS — Z96642 Presence of left artificial hip joint: Secondary | ICD-10-CM | POA: Diagnosis not present

## 2023-11-19 DIAGNOSIS — T783XXA Angioneurotic edema, initial encounter: Secondary | ICD-10-CM | POA: Diagnosis not present

## 2023-11-19 DIAGNOSIS — R2681 Unsteadiness on feet: Secondary | ICD-10-CM

## 2023-11-19 DIAGNOSIS — R251 Tremor, unspecified: Secondary | ICD-10-CM | POA: Diagnosis not present

## 2023-11-19 DIAGNOSIS — M6281 Muscle weakness (generalized): Secondary | ICD-10-CM

## 2023-11-19 DIAGNOSIS — D649 Anemia, unspecified: Secondary | ICD-10-CM | POA: Diagnosis not present

## 2023-11-19 DIAGNOSIS — R252 Cramp and spasm: Secondary | ICD-10-CM | POA: Diagnosis not present

## 2023-11-19 DIAGNOSIS — R293 Abnormal posture: Secondary | ICD-10-CM | POA: Diagnosis not present

## 2023-11-19 DIAGNOSIS — W19XXXA Unspecified fall, initial encounter: Secondary | ICD-10-CM | POA: Diagnosis not present

## 2023-11-19 DIAGNOSIS — R262 Difficulty in walking, not elsewhere classified: Secondary | ICD-10-CM

## 2023-11-19 DIAGNOSIS — R278 Other lack of coordination: Secondary | ICD-10-CM | POA: Diagnosis not present
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
## 2023-11-20 ENCOUNTER — Encounter: Payer: Self-pay | Admitting: Rehabilitative and Restorative Service Providers"

## 2023-11-20 ENCOUNTER — Ambulatory Visit: Admitting: Rehabilitative and Restorative Service Providers"

## 2023-11-20 ENCOUNTER — Telehealth: Payer: Self-pay | Admitting: Orthopaedic Surgery

## 2023-11-20 ENCOUNTER — Encounter: Admitting: Orthopaedic Surgery

## 2023-11-20 DIAGNOSIS — R262 Difficulty in walking, not elsewhere classified: Secondary | ICD-10-CM | POA: Diagnosis not present

## 2023-11-20 DIAGNOSIS — R2681 Unsteadiness on feet: Secondary | ICD-10-CM

## 2023-11-20 DIAGNOSIS — M6281 Muscle weakness (generalized): Secondary | ICD-10-CM

## 2023-11-20 DIAGNOSIS — R278 Other lack of coordination: Secondary | ICD-10-CM | POA: Diagnosis not present

## 2023-11-20 DIAGNOSIS — R293 Abnormal posture: Secondary | ICD-10-CM | POA: Diagnosis not present

## 2023-11-20 DIAGNOSIS — Z96642 Presence of left artificial hip joint: Secondary | ICD-10-CM | POA: Diagnosis not present

## 2023-11-20 DIAGNOSIS — R252 Cramp and spasm: Secondary | ICD-10-CM | POA: Diagnosis not present

## 2023-11-20 NOTE — Telephone Encounter (Signed)
 Candace Bailey can we get [facility] set up after they have started outpatient PT?
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
## 2023-11-20 NOTE — Telephone Encounter (Signed)
 Patient called and ask if you could request from the insurance for the home health. CB#[phone]

## 2023-11-21 ENCOUNTER — Telehealth: Payer: Self-pay | Admitting: Orthopaedic Surgery

## 2023-11-21 DIAGNOSIS — Z96642 Presence of left artificial hip joint: Secondary | ICD-10-CM | POA: Diagnosis not present

## 2023-11-21 DIAGNOSIS — Z556 Problems related to health literacy: Secondary | ICD-10-CM | POA: Diagnosis not present

## 2023-11-21 DIAGNOSIS — Z471 Aftercare following joint replacement surgery: Secondary | ICD-10-CM | POA: Diagnosis not present

## 2023-11-21 DIAGNOSIS — I69351 Hemiplegia and hemiparesis following cerebral infarction affecting right dominant side: Secondary | ICD-10-CM | POA: Diagnosis not present

## 2023-11-21 DIAGNOSIS — Z993 Dependence on wheelchair: Secondary | ICD-10-CM | POA: Diagnosis not present

## 2023-11-21 NOTE — Telephone Encounter (Signed)
 Keyy (PT) from [facility] called requesting verbal orders to extend pt for 2wk 4. [employer] secure number is [phone].

## 2023-11-21 NOTE — Telephone Encounter (Signed)
Lvm giving verbal ok 

## 2023-11-21 NOTE — Telephone Encounter (Signed)
 Correct on Therapist name is Loetta Ringer not [employer]

## 2023-11-21 NOTE — Telephone Encounter (Signed)
 Tried to contact pt to advise this was being looked into

## 2023-11-24 ENCOUNTER — Telehealth: Payer: Self-pay | Admitting: Orthopaedic Surgery

## 2023-11-24 NOTE — Telephone Encounter (Signed)
 Patients mom aware [facility] is going to extend HHPT

## 2023-11-24 NOTE — Telephone Encounter (Signed)
 Patient mom called and said she needed to speak with you returning your call. I told her that you are with patients and I will have you to call her back and she got an attitude and said well we don't answer numbers we don't know. So then she hung up. CB#[phone]

## 2023-11-25 ENCOUNTER — Ambulatory Visit

## 2023-11-25 DIAGNOSIS — R252 Cramp and spasm: Secondary | ICD-10-CM | POA: Diagnosis not present

## 2023-11-25 DIAGNOSIS — R278 Other lack of coordination: Secondary | ICD-10-CM

## 2023-11-25 DIAGNOSIS — R262 Difficulty in walking, not elsewhere classified: Secondary | ICD-10-CM | POA: Diagnosis not present

## 2023-11-25 DIAGNOSIS — R293 Abnormal posture: Secondary | ICD-10-CM | POA: Diagnosis not present

## 2023-11-25 DIAGNOSIS — M6281 Muscle weakness (generalized): Secondary | ICD-10-CM | POA: Diagnosis not present

## 2023-11-25 DIAGNOSIS — Z96642 Presence of left artificial hip joint: Secondary | ICD-10-CM | POA: Diagnosis not present

## 2023-11-25 DIAGNOSIS — R2681 Unsteadiness on feet: Secondary | ICD-10-CM | POA: Diagnosis not present
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
## 2023-11-26 DIAGNOSIS — Z993 Dependence on wheelchair: Secondary | ICD-10-CM | POA: Diagnosis not present

## 2023-11-26 DIAGNOSIS — Z471 Aftercare following joint replacement surgery: Secondary | ICD-10-CM | POA: Diagnosis not present

## 2023-11-26 DIAGNOSIS — I69351 Hemiplegia and hemiparesis following cerebral infarction affecting right dominant side: Secondary | ICD-10-CM | POA: Diagnosis not present

## 2023-11-26 DIAGNOSIS — Z556 Problems related to health literacy: Secondary | ICD-10-CM | POA: Diagnosis not present

## 2023-11-26 DIAGNOSIS — Z96642 Presence of left artificial hip joint: Secondary | ICD-10-CM | POA: Diagnosis not present

## 2023-11-27 ENCOUNTER — Ambulatory Visit: Attending: Orthopaedic Surgery | Admitting: Physical Therapy

## 2023-11-27 DIAGNOSIS — R252 Cramp and spasm: Secondary | ICD-10-CM | POA: Insufficient documentation

## 2023-11-27 DIAGNOSIS — R2681 Unsteadiness on feet: Secondary | ICD-10-CM | POA: Diagnosis not present

## 2023-11-27 DIAGNOSIS — R278 Other lack of coordination: Secondary | ICD-10-CM | POA: Diagnosis not present

## 2023-11-27 DIAGNOSIS — R293 Abnormal posture: Secondary | ICD-10-CM | POA: Insufficient documentation

## 2023-11-27 DIAGNOSIS — R2689 Other abnormalities of gait and mobility: Secondary | ICD-10-CM | POA: Insufficient documentation

## 2023-11-27 DIAGNOSIS — M6281 Muscle weakness (generalized): Secondary | ICD-10-CM | POA: Diagnosis not present

## 2023-11-27 DIAGNOSIS — R262 Difficulty in walking, not elsewhere classified: Secondary | ICD-10-CM | POA: Diagnosis not present
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
## 2023-11-28 DIAGNOSIS — Z471 Aftercare following joint replacement surgery: Secondary | ICD-10-CM | POA: Diagnosis not present

## 2023-11-28 DIAGNOSIS — Z96642 Presence of left artificial hip joint: Secondary | ICD-10-CM | POA: Diagnosis not present

## 2023-11-28 DIAGNOSIS — I69351 Hemiplegia and hemiparesis following cerebral infarction affecting right dominant side: Secondary | ICD-10-CM | POA: Diagnosis not present

## 2023-11-28 DIAGNOSIS — Z556 Problems related to health literacy: Secondary | ICD-10-CM | POA: Diagnosis not present

## 2023-11-28 DIAGNOSIS — Z993 Dependence on wheelchair: Secondary | ICD-10-CM | POA: Diagnosis not present

## 2023-12-01 ENCOUNTER — Telehealth: Payer: Self-pay | Admitting: Radiology

## 2023-12-01 ENCOUNTER — Ambulatory Visit

## 2023-12-01 DIAGNOSIS — R278 Other lack of coordination: Secondary | ICD-10-CM

## 2023-12-01 DIAGNOSIS — R293 Abnormal posture: Secondary | ICD-10-CM | POA: Diagnosis not present

## 2023-12-01 DIAGNOSIS — R2681 Unsteadiness on feet: Secondary | ICD-10-CM

## 2023-12-01 DIAGNOSIS — R252 Cramp and spasm: Secondary | ICD-10-CM

## 2023-12-01 DIAGNOSIS — M6281 Muscle weakness (generalized): Secondary | ICD-10-CM

## 2023-12-01 DIAGNOSIS — R2689 Other abnormalities of gait and mobility: Secondary | ICD-10-CM | POA: Diagnosis not present

## 2023-12-01 DIAGNOSIS — R262 Difficulty in walking, not elsewhere classified: Secondary | ICD-10-CM | POA: Diagnosis not present
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
## 2023-12-01 NOTE — Telephone Encounter (Signed)
 Patient's mom called, was told by patient that she needed to call [facility].  I do not see a DPR- look behind me.  Patient's mom is asking for a call.

## 2023-12-02 DIAGNOSIS — Z993 Dependence on wheelchair: Secondary | ICD-10-CM | POA: Diagnosis not present

## 2023-12-02 DIAGNOSIS — Z471 Aftercare following joint replacement surgery: Secondary | ICD-10-CM | POA: Diagnosis not present

## 2023-12-02 DIAGNOSIS — Z96642 Presence of left artificial hip joint: Secondary | ICD-10-CM | POA: Diagnosis not present

## 2023-12-02 DIAGNOSIS — Z556 Problems related to health literacy: Secondary | ICD-10-CM | POA: Diagnosis not present

## 2023-12-02 DIAGNOSIS — I69351 Hemiplegia and hemiparesis following cerebral infarction affecting right dominant side: Secondary | ICD-10-CM | POA: Diagnosis not present

## 2023-12-03 ENCOUNTER — Ambulatory Visit: Admitting: Physical Therapy

## 2023-12-03 DIAGNOSIS — M6281 Muscle weakness (generalized): Secondary | ICD-10-CM | POA: Diagnosis not present

## 2023-12-03 DIAGNOSIS — R2681 Unsteadiness on feet: Secondary | ICD-10-CM

## 2023-12-03 DIAGNOSIS — R252 Cramp and spasm: Secondary | ICD-10-CM | POA: Diagnosis not present

## 2023-12-03 DIAGNOSIS — R262 Difficulty in walking, not elsewhere classified: Secondary | ICD-10-CM

## 2023-12-03 DIAGNOSIS — R278 Other lack of coordination: Secondary | ICD-10-CM | POA: Diagnosis not present

## 2023-12-03 DIAGNOSIS — R2689 Other abnormalities of gait and mobility: Secondary | ICD-10-CM | POA: Diagnosis not present

## 2023-12-03 DIAGNOSIS — R293 Abnormal posture: Secondary | ICD-10-CM | POA: Diagnosis not present
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
## 2023-12-04 ENCOUNTER — Telehealth: Payer: Self-pay

## 2023-12-04 DIAGNOSIS — Z471 Aftercare following joint replacement surgery: Secondary | ICD-10-CM | POA: Diagnosis not present

## 2023-12-04 DIAGNOSIS — Z96642 Presence of left artificial hip joint: Secondary | ICD-10-CM | POA: Diagnosis not present

## 2023-12-04 DIAGNOSIS — Z556 Problems related to health literacy: Secondary | ICD-10-CM | POA: Diagnosis not present

## 2023-12-04 DIAGNOSIS — Z993 Dependence on wheelchair: Secondary | ICD-10-CM | POA: Diagnosis not present

## 2023-12-04 DIAGNOSIS — I69351 Hemiplegia and hemiparesis following cerebral infarction affecting right dominant side: Secondary | ICD-10-CM | POA: Diagnosis not present

## 2023-12-04 NOTE — Telephone Encounter (Signed)
 LMOM for patient that she can start doing that again if she would like

## 2023-12-04 NOTE — Telephone Encounter (Signed)
 Pt contacted me and asked when she will be allowed to use scat for transportation. Can you please all to advise? S/p total hip

## 2023-12-08 ENCOUNTER — Ambulatory Visit

## 2023-12-08 DIAGNOSIS — R293 Abnormal posture: Secondary | ICD-10-CM | POA: Diagnosis not present

## 2023-12-08 DIAGNOSIS — R278 Other lack of coordination: Secondary | ICD-10-CM | POA: Diagnosis not present

## 2023-12-08 DIAGNOSIS — R2681 Unsteadiness on feet: Secondary | ICD-10-CM | POA: Diagnosis not present

## 2023-12-08 DIAGNOSIS — R252 Cramp and spasm: Secondary | ICD-10-CM

## 2023-12-08 DIAGNOSIS — M6281 Muscle weakness (generalized): Secondary | ICD-10-CM

## 2023-12-08 DIAGNOSIS — R2689 Other abnormalities of gait and mobility: Secondary | ICD-10-CM | POA: Diagnosis not present

## 2023-12-08 DIAGNOSIS — R262 Difficulty in walking, not elsewhere classified: Secondary | ICD-10-CM
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
## 2023-12-10 ENCOUNTER — Ambulatory Visit: Admitting: Physical Therapy

## 2023-12-10 DIAGNOSIS — R293 Abnormal posture: Secondary | ICD-10-CM | POA: Diagnosis not present

## 2023-12-10 DIAGNOSIS — M6281 Muscle weakness (generalized): Secondary | ICD-10-CM

## 2023-12-10 DIAGNOSIS — R2681 Unsteadiness on feet: Secondary | ICD-10-CM | POA: Diagnosis not present

## 2023-12-10 DIAGNOSIS — R252 Cramp and spasm: Secondary | ICD-10-CM | POA: Diagnosis not present

## 2023-12-10 DIAGNOSIS — R278 Other lack of coordination: Secondary | ICD-10-CM | POA: Diagnosis not present

## 2023-12-10 DIAGNOSIS — R262 Difficulty in walking, not elsewhere classified: Secondary | ICD-10-CM | POA: Diagnosis not present

## 2023-12-10 DIAGNOSIS — R2689 Other abnormalities of gait and mobility: Secondary | ICD-10-CM | POA: Diagnosis not present
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
## 2023-12-15 ENCOUNTER — Encounter: Payer: Self-pay | Admitting: Physical Therapy

## 2023-12-15 ENCOUNTER — Ambulatory Visit: Admitting: Physical Therapy

## 2023-12-15 DIAGNOSIS — R2681 Unsteadiness on feet: Secondary | ICD-10-CM

## 2023-12-15 DIAGNOSIS — R252 Cramp and spasm: Secondary | ICD-10-CM | POA: Diagnosis not present

## 2023-12-15 DIAGNOSIS — R262 Difficulty in walking, not elsewhere classified: Secondary | ICD-10-CM

## 2023-12-15 DIAGNOSIS — R2689 Other abnormalities of gait and mobility: Secondary | ICD-10-CM | POA: Diagnosis not present

## 2023-12-15 DIAGNOSIS — M6281 Muscle weakness (generalized): Secondary | ICD-10-CM

## 2023-12-15 DIAGNOSIS — R278 Other lack of coordination: Secondary | ICD-10-CM | POA: Diagnosis not present

## 2023-12-15 DIAGNOSIS — R293 Abnormal posture: Secondary | ICD-10-CM | POA: Diagnosis not present
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
## 2023-12-17 ENCOUNTER — Ambulatory Visit

## 2023-12-17 DIAGNOSIS — R2681 Unsteadiness on feet: Secondary | ICD-10-CM

## 2023-12-17 DIAGNOSIS — R2689 Other abnormalities of gait and mobility: Secondary | ICD-10-CM

## 2023-12-17 DIAGNOSIS — R262 Difficulty in walking, not elsewhere classified: Secondary | ICD-10-CM

## 2023-12-17 DIAGNOSIS — R278 Other lack of coordination: Secondary | ICD-10-CM | POA: Diagnosis not present

## 2023-12-17 DIAGNOSIS — M6281 Muscle weakness (generalized): Secondary | ICD-10-CM

## 2023-12-17 DIAGNOSIS — R252 Cramp and spasm: Secondary | ICD-10-CM

## 2023-12-17 DIAGNOSIS — R293 Abnormal posture: Secondary | ICD-10-CM
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
## 2023-12-23 ENCOUNTER — Ambulatory Visit: Admitting: Rehabilitative and Restorative Service Providers"

## 2023-12-23 ENCOUNTER — Encounter: Payer: Self-pay | Admitting: Rehabilitative and Restorative Service Providers"

## 2023-12-23 DIAGNOSIS — R293 Abnormal posture: Secondary | ICD-10-CM | POA: Diagnosis not present

## 2023-12-23 DIAGNOSIS — M6281 Muscle weakness (generalized): Secondary | ICD-10-CM

## 2023-12-23 DIAGNOSIS — R252 Cramp and spasm: Secondary | ICD-10-CM

## 2023-12-23 DIAGNOSIS — R2681 Unsteadiness on feet: Secondary | ICD-10-CM

## 2023-12-23 DIAGNOSIS — R278 Other lack of coordination: Secondary | ICD-10-CM | POA: Diagnosis not present

## 2023-12-23 DIAGNOSIS — R262 Difficulty in walking, not elsewhere classified: Secondary | ICD-10-CM

## 2023-12-23 DIAGNOSIS — R2689 Other abnormalities of gait and mobility: Secondary | ICD-10-CM | POA: Diagnosis not present
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
## 2023-12-25 ENCOUNTER — Ambulatory Visit

## 2023-12-25 DIAGNOSIS — R278 Other lack of coordination: Secondary | ICD-10-CM

## 2023-12-25 DIAGNOSIS — M6281 Muscle weakness (generalized): Secondary | ICD-10-CM

## 2023-12-25 DIAGNOSIS — R293 Abnormal posture: Secondary | ICD-10-CM | POA: Diagnosis not present

## 2023-12-25 DIAGNOSIS — R252 Cramp and spasm: Secondary | ICD-10-CM | POA: Diagnosis not present

## 2023-12-25 DIAGNOSIS — R262 Difficulty in walking, not elsewhere classified: Secondary | ICD-10-CM

## 2023-12-25 DIAGNOSIS — R2681 Unsteadiness on feet: Secondary | ICD-10-CM | POA: Diagnosis not present

## 2023-12-25 DIAGNOSIS — R2689 Other abnormalities of gait and mobility: Secondary | ICD-10-CM | POA: Diagnosis not present
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
## 2023-12-29 ENCOUNTER — Other Ambulatory Visit: Payer: Self-pay | Admitting: Radiology

## 2023-12-29 ENCOUNTER — Encounter: Payer: Self-pay | Admitting: Physical Therapy

## 2023-12-29 ENCOUNTER — Telehealth: Payer: Self-pay | Admitting: Orthopaedic Surgery

## 2023-12-29 ENCOUNTER — Ambulatory Visit: Attending: Orthopaedic Surgery | Admitting: Physical Therapy

## 2023-12-29 DIAGNOSIS — R2689 Other abnormalities of gait and mobility: Secondary | ICD-10-CM | POA: Diagnosis not present

## 2023-12-29 DIAGNOSIS — M6281 Muscle weakness (generalized): Secondary | ICD-10-CM | POA: Diagnosis not present

## 2023-12-29 DIAGNOSIS — R293 Abnormal posture: Secondary | ICD-10-CM | POA: Insufficient documentation

## 2023-12-29 DIAGNOSIS — Z96642 Presence of left artificial hip joint: Secondary | ICD-10-CM

## 2023-12-29 DIAGNOSIS — R252 Cramp and spasm: Secondary | ICD-10-CM | POA: Insufficient documentation

## 2023-12-29 DIAGNOSIS — R262 Difficulty in walking, not elsewhere classified: Secondary | ICD-10-CM | POA: Diagnosis not present

## 2023-12-29 DIAGNOSIS — R2681 Unsteadiness on feet: Secondary | ICD-10-CM | POA: Insufficient documentation

## 2023-12-29 DIAGNOSIS — R278 Other lack of coordination: Secondary | ICD-10-CM | POA: Insufficient documentation

## 2023-12-29 NOTE — Telephone Encounter (Signed)
 Mary patient's mother request information regarding the brace for her left lower leg and the possibility of doing OT

## 2023-12-30 NOTE — Telephone Encounter (Signed)
 Called patient back. [facility] is the only place that now makes and adjusts the AFO's.

## 2023-12-31 ENCOUNTER — Ambulatory Visit

## 2023-12-31 DIAGNOSIS — R2681 Unsteadiness on feet: Secondary | ICD-10-CM | POA: Diagnosis not present

## 2023-12-31 DIAGNOSIS — R293 Abnormal posture: Secondary | ICD-10-CM

## 2023-12-31 DIAGNOSIS — R262 Difficulty in walking, not elsewhere classified: Secondary | ICD-10-CM

## 2023-12-31 DIAGNOSIS — R252 Cramp and spasm: Secondary | ICD-10-CM | POA: Diagnosis not present

## 2023-12-31 DIAGNOSIS — M6281 Muscle weakness (generalized): Secondary | ICD-10-CM | POA: Diagnosis not present

## 2023-12-31 DIAGNOSIS — R278 Other lack of coordination: Secondary | ICD-10-CM

## 2023-12-31 DIAGNOSIS — R2689 Other abnormalities of gait and mobility: Secondary | ICD-10-CM | POA: Diagnosis not present
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
## 2024-01-05 ENCOUNTER — Encounter: Payer: Self-pay | Admitting: Physical Therapy

## 2024-01-05 ENCOUNTER — Ambulatory Visit: Admitting: Physical Therapy

## 2024-01-05 DIAGNOSIS — R2689 Other abnormalities of gait and mobility: Secondary | ICD-10-CM | POA: Diagnosis not present

## 2024-01-05 DIAGNOSIS — R293 Abnormal posture: Secondary | ICD-10-CM | POA: Diagnosis not present

## 2024-01-05 DIAGNOSIS — R278 Other lack of coordination: Secondary | ICD-10-CM | POA: Diagnosis not present

## 2024-01-05 DIAGNOSIS — R262 Difficulty in walking, not elsewhere classified: Secondary | ICD-10-CM | POA: Diagnosis not present

## 2024-01-05 DIAGNOSIS — M6281 Muscle weakness (generalized): Secondary | ICD-10-CM | POA: Diagnosis not present

## 2024-01-05 DIAGNOSIS — R2681 Unsteadiness on feet: Secondary | ICD-10-CM

## 2024-01-05 DIAGNOSIS — R252 Cramp and spasm: Secondary | ICD-10-CM
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
## 2024-01-07 ENCOUNTER — Ambulatory Visit: Admitting: Physical Therapy

## 2024-01-07 DIAGNOSIS — R262 Difficulty in walking, not elsewhere classified: Secondary | ICD-10-CM | POA: Diagnosis not present

## 2024-01-07 DIAGNOSIS — R252 Cramp and spasm: Secondary | ICD-10-CM | POA: Diagnosis not present

## 2024-01-07 DIAGNOSIS — R2681 Unsteadiness on feet: Secondary | ICD-10-CM

## 2024-01-07 DIAGNOSIS — M6281 Muscle weakness (generalized): Secondary | ICD-10-CM

## 2024-01-07 DIAGNOSIS — R293 Abnormal posture: Secondary | ICD-10-CM | POA: Diagnosis not present

## 2024-01-07 DIAGNOSIS — R2689 Other abnormalities of gait and mobility: Secondary | ICD-10-CM | POA: Diagnosis not present

## 2024-01-07 DIAGNOSIS — R278 Other lack of coordination: Secondary | ICD-10-CM | POA: Diagnosis not present
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
## 2024-01-21 DIAGNOSIS — R053 Chronic cough: Secondary | ICD-10-CM | POA: Diagnosis not present

## 2024-01-26 ENCOUNTER — Ambulatory Visit: Attending: Physician Assistant | Admitting: Occupational Therapy

## 2024-01-26 ENCOUNTER — Other Ambulatory Visit: Payer: Self-pay

## 2024-01-26 DIAGNOSIS — I69351 Hemiplegia and hemiparesis following cerebral infarction affecting right dominant side: Secondary | ICD-10-CM | POA: Diagnosis not present

## 2024-01-26 DIAGNOSIS — R208 Other disturbances of skin sensation: Secondary | ICD-10-CM | POA: Insufficient documentation

## 2024-01-26 DIAGNOSIS — M6281 Muscle weakness (generalized): Secondary | ICD-10-CM | POA: Diagnosis not present

## 2024-01-26 DIAGNOSIS — R278 Other lack of coordination: Secondary | ICD-10-CM | POA: Insufficient documentation

## 2024-01-26 DIAGNOSIS — Z96642 Presence of left artificial hip joint: Secondary | ICD-10-CM | POA: Diagnosis not present
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
## 2024-02-23 ENCOUNTER — Encounter: Admitting: Occupational Therapy

## 2024-02-24 DIAGNOSIS — H524 Presbyopia: Secondary | ICD-10-CM | POA: Diagnosis not present

## 2024-02-26 ENCOUNTER — Ambulatory Visit: Attending: Physician Assistant | Admitting: Occupational Therapy

## 2024-02-26 DIAGNOSIS — R278 Other lack of coordination: Secondary | ICD-10-CM | POA: Diagnosis not present

## 2024-02-26 DIAGNOSIS — R208 Other disturbances of skin sensation: Secondary | ICD-10-CM | POA: Insufficient documentation

## 2024-02-26 DIAGNOSIS — M6281 Muscle weakness (generalized): Secondary | ICD-10-CM | POA: Diagnosis not present
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
## 2024-03-01 ENCOUNTER — Encounter: Admitting: Occupational Therapy

## 2024-03-03 ENCOUNTER — Ambulatory Visit: Attending: Physician Assistant | Admitting: Occupational Therapy

## 2024-03-03 DIAGNOSIS — R208 Other disturbances of skin sensation: Secondary | ICD-10-CM | POA: Insufficient documentation

## 2024-03-03 DIAGNOSIS — R278 Other lack of coordination: Secondary | ICD-10-CM | POA: Insufficient documentation

## 2024-03-03 DIAGNOSIS — I69351 Hemiplegia and hemiparesis following cerebral infarction affecting right dominant side: Secondary | ICD-10-CM | POA: Insufficient documentation

## 2024-03-03 DIAGNOSIS — M6281 Muscle weakness (generalized): Secondary | ICD-10-CM | POA: Insufficient documentation

## 2024-03-08 ENCOUNTER — Encounter: Admitting: Occupational Therapy

## 2024-03-08 ENCOUNTER — Telehealth: Payer: Self-pay

## 2024-03-08 NOTE — Telephone Encounter (Signed)
 Talked with Candace Bailey and advised her of Dr. Damian protocol before dental procedures.  Voiced that she understands.

## 2024-03-09 ENCOUNTER — Ambulatory Visit: Admitting: Occupational Therapy

## 2024-03-09 ENCOUNTER — Telehealth: Payer: Self-pay | Admitting: Occupational Therapy

## 2024-03-09 NOTE — Telephone Encounter (Signed)
 Called pt due to No Show for appt today as well as last week.  Left a HIPAA compliant voice mail asking for pt to return phone call.

## 2024-03-15 ENCOUNTER — Encounter: Admitting: Occupational Therapy

## 2024-03-16 ENCOUNTER — Ambulatory Visit: Admitting: Occupational Therapy

## 2024-03-16 DIAGNOSIS — R208 Other disturbances of skin sensation: Secondary | ICD-10-CM | POA: Diagnosis not present

## 2024-03-16 DIAGNOSIS — R278 Other lack of coordination: Secondary | ICD-10-CM | POA: Diagnosis not present

## 2024-03-16 DIAGNOSIS — M6281 Muscle weakness (generalized): Secondary | ICD-10-CM

## 2024-03-16 DIAGNOSIS — I69351 Hemiplegia and hemiparesis following cerebral infarction affecting right dominant side: Secondary | ICD-10-CM | POA: Diagnosis not present
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
## 2024-03-22 ENCOUNTER — Encounter: Admitting: Occupational Therapy

## 2024-03-23 ENCOUNTER — Ambulatory Visit: Admitting: Occupational Therapy

## 2024-03-23 DIAGNOSIS — R208 Other disturbances of skin sensation: Secondary | ICD-10-CM | POA: Diagnosis not present

## 2024-03-23 DIAGNOSIS — M6281 Muscle weakness (generalized): Secondary | ICD-10-CM | POA: Diagnosis not present

## 2024-03-23 DIAGNOSIS — R278 Other lack of coordination: Secondary | ICD-10-CM

## 2024-03-23 DIAGNOSIS — I69351 Hemiplegia and hemiparesis following cerebral infarction affecting right dominant side: Secondary | ICD-10-CM | POA: Diagnosis not present
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
## 2024-03-30 ENCOUNTER — Ambulatory Visit: Attending: Physician Assistant | Admitting: Occupational Therapy

## 2024-03-30 DIAGNOSIS — M6281 Muscle weakness (generalized): Secondary | ICD-10-CM | POA: Insufficient documentation

## 2024-03-30 DIAGNOSIS — R208 Other disturbances of skin sensation: Secondary | ICD-10-CM | POA: Diagnosis not present

## 2024-03-30 DIAGNOSIS — R278 Other lack of coordination: Secondary | ICD-10-CM | POA: Diagnosis not present
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
## 2024-04-06 ENCOUNTER — Ambulatory Visit: Payer: Self-pay | Admitting: Occupational Therapy

## 2024-04-06 DIAGNOSIS — R278 Other lack of coordination: Secondary | ICD-10-CM | POA: Diagnosis not present

## 2024-04-06 DIAGNOSIS — R208 Other disturbances of skin sensation: Secondary | ICD-10-CM | POA: Diagnosis not present

## 2024-04-06 DIAGNOSIS — M6281 Muscle weakness (generalized): Secondary | ICD-10-CM
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
## 2024-04-13 ENCOUNTER — Ambulatory Visit: Payer: Self-pay | Admitting: Occupational Therapy

## 2024-04-13 DIAGNOSIS — M6281 Muscle weakness (generalized): Secondary | ICD-10-CM | POA: Diagnosis not present

## 2024-04-13 DIAGNOSIS — R278 Other lack of coordination: Secondary | ICD-10-CM

## 2024-04-13 DIAGNOSIS — R208 Other disturbances of skin sensation: Secondary | ICD-10-CM | POA: Diagnosis not present
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
## 2024-07-01 ENCOUNTER — Other Ambulatory Visit: Payer: Self-pay | Admitting: Internal Medicine

## 2024-07-01 DIAGNOSIS — Z1231 Encounter for screening mammogram for malignant neoplasm of breast: Secondary | ICD-10-CM

## 2024-07-12 ENCOUNTER — Encounter: Payer: Self-pay | Admitting: Internal Medicine

## 2024-07-12 DIAGNOSIS — Z1231 Encounter for screening mammogram for malignant neoplasm of breast: Secondary | ICD-10-CM

## 2024-07-20 ENCOUNTER — Ambulatory Visit
